# Patient Record
Sex: Female | Born: 1985 | Race: Black or African American | Hispanic: No
Health system: Southern US, Community
[De-identification: ages and names within clinical notes are randomized; demographics above are authoritative.]

## PROBLEM LIST (undated history)

## (undated) DIAGNOSIS — G43909 Migraine, unspecified, not intractable, without status migrainosus: Secondary | ICD-10-CM

## (undated) DIAGNOSIS — F988 Other specified behavioral and emotional disorders with onset usually occurring in childhood and adolescence: Secondary | ICD-10-CM

## (undated) DIAGNOSIS — G935 Compression of brain: Secondary | ICD-10-CM

## (undated) HISTORY — PX: KNEE SURGERY: SHX244

## (undated) HISTORY — DX: Migraine, unspecified, not intractable, without status migrainosus: G43.909

## (undated) HISTORY — DX: Other specified behavioral and emotional disorders with onset usually occurring in childhood and adolescence: F98.8

## (undated) HISTORY — PX: BREAST REDUCTION SURGERY: SHX8

---

## 2005-06-03 ENCOUNTER — Emergency Department (HOSPITAL_COMMUNITY): Admission: EM | Admit: 2005-06-03 | Discharge: 2005-06-03 | Payer: Self-pay | Admitting: Emergency Medicine

## 2005-10-05 ENCOUNTER — Emergency Department (HOSPITAL_COMMUNITY): Admission: EM | Admit: 2005-10-05 | Discharge: 2005-10-06 | Payer: Self-pay | Admitting: Emergency Medicine

## 2006-05-04 ENCOUNTER — Emergency Department (HOSPITAL_COMMUNITY): Admission: EM | Admit: 2006-05-04 | Discharge: 2006-05-04 | Payer: Self-pay | Admitting: Emergency Medicine

## 2006-12-30 ENCOUNTER — Emergency Department (HOSPITAL_COMMUNITY): Admission: EM | Admit: 2006-12-30 | Discharge: 2006-12-30 | Payer: Self-pay | Admitting: Emergency Medicine

## 2007-10-30 ENCOUNTER — Emergency Department (HOSPITAL_COMMUNITY): Admission: EM | Admit: 2007-10-30 | Discharge: 2007-10-30 | Payer: Self-pay | Admitting: Emergency Medicine

## 2008-02-03 ENCOUNTER — Emergency Department (HOSPITAL_COMMUNITY): Admission: EM | Admit: 2008-02-03 | Discharge: 2008-02-03 | Payer: Self-pay | Admitting: Emergency Medicine

## 2008-08-02 ENCOUNTER — Emergency Department (HOSPITAL_COMMUNITY): Admission: EM | Admit: 2008-08-02 | Discharge: 2008-08-02 | Payer: Self-pay | Admitting: Emergency Medicine

## 2008-08-17 ENCOUNTER — Emergency Department (HOSPITAL_COMMUNITY): Admission: EM | Admit: 2008-08-17 | Discharge: 2008-08-17 | Payer: Self-pay | Admitting: Emergency Medicine

## 2008-11-29 ENCOUNTER — Emergency Department (HOSPITAL_COMMUNITY): Admission: EM | Admit: 2008-11-29 | Discharge: 2008-11-29 | Payer: Self-pay | Admitting: Emergency Medicine

## 2009-03-13 ENCOUNTER — Emergency Department (HOSPITAL_COMMUNITY): Admission: EM | Admit: 2009-03-13 | Discharge: 2009-03-14 | Payer: Self-pay | Admitting: Emergency Medicine

## 2009-04-05 ENCOUNTER — Emergency Department (HOSPITAL_COMMUNITY): Admission: EM | Admit: 2009-04-05 | Discharge: 2009-04-05 | Payer: Self-pay | Admitting: Emergency Medicine

## 2010-12-20 ENCOUNTER — Emergency Department (HOSPITAL_COMMUNITY)
Admission: EM | Admit: 2010-12-20 | Discharge: 2010-12-20 | Disposition: A | Discharge status: Home / Self Care | Payer: BC Managed Care – PPO | Attending: Emergency Medicine | Admitting: Emergency Medicine

## 2010-12-20 ENCOUNTER — Emergency Department (HOSPITAL_COMMUNITY): Payer: BC Managed Care – PPO

## 2010-12-20 DIAGNOSIS — Y9343 Activity, gymnastics: Secondary | ICD-10-CM | POA: Insufficient documentation

## 2010-12-20 DIAGNOSIS — M25569 Pain in unspecified knee: Secondary | ICD-10-CM | POA: Insufficient documentation

## 2010-12-20 DIAGNOSIS — M2390 Unspecified internal derangement of unspecified knee: Secondary | ICD-10-CM | POA: Insufficient documentation

## 2010-12-20 DIAGNOSIS — Y92009 Unspecified place in unspecified non-institutional (private) residence as the place of occurrence of the external cause: Secondary | ICD-10-CM | POA: Insufficient documentation

## 2010-12-20 DIAGNOSIS — X58XXXA Exposure to other specified factors, initial encounter: Secondary | ICD-10-CM | POA: Insufficient documentation

## 2010-12-23 LAB — URINALYSIS, ROUTINE W REFLEX MICROSCOPIC
Bilirubin Urine: NEGATIVE
Glucose, UA: NEGATIVE mg/dL
Hgb urine dipstick: NEGATIVE
Ketones, ur: NEGATIVE mg/dL
Nitrite: NEGATIVE
Protein, ur: NEGATIVE mg/dL
Specific Gravity, Urine: 1.026 (ref 1.005–1.030)
Urobilinogen, UA: 0.2 mg/dL (ref 0.0–1.0)
pH: 6.5 (ref 5.0–8.0)

## 2010-12-23 LAB — POCT PREGNANCY, URINE: Preg Test, Ur: NEGATIVE

## 2010-12-23 LAB — URINE MICROSCOPIC-ADD ON

## 2010-12-24 LAB — URINALYSIS, ROUTINE W REFLEX MICROSCOPIC
Bilirubin Urine: NEGATIVE
Ketones, ur: NEGATIVE mg/dL
Nitrite: NEGATIVE
pH: 7.5 (ref 5.0–8.0)

## 2010-12-24 LAB — WET PREP, GENITAL
Clue Cells Wet Prep HPF POC: NONE SEEN
Trich, Wet Prep: NONE SEEN

## 2010-12-24 LAB — URINE MICROSCOPIC-ADD ON

## 2010-12-24 LAB — GC/CHLAMYDIA PROBE AMP, GENITAL: Chlamydia, DNA Probe: NEGATIVE

## 2010-12-24 LAB — POCT PREGNANCY, URINE: Preg Test, Ur: NEGATIVE

## 2010-12-27 LAB — URINALYSIS, ROUTINE W REFLEX MICROSCOPIC
Nitrite: NEGATIVE
Protein, ur: 100 mg/dL — AB
Specific Gravity, Urine: 1.043 — ABNORMAL HIGH (ref 1.005–1.030)
Urobilinogen, UA: 1 mg/dL (ref 0.0–1.0)

## 2010-12-27 LAB — GC/CHLAMYDIA PROBE AMP, GENITAL: Chlamydia, DNA Probe: NEGATIVE

## 2010-12-27 LAB — URINE MICROSCOPIC-ADD ON

## 2011-03-09 ENCOUNTER — Emergency Department (HOSPITAL_COMMUNITY): Payer: Self-pay

## 2011-03-09 ENCOUNTER — Emergency Department (HOSPITAL_COMMUNITY)
Admission: EM | Admit: 2011-03-09 | Discharge: 2011-03-09 | Disposition: A | Discharge status: Home / Self Care | Payer: Self-pay | Attending: Emergency Medicine | Admitting: Emergency Medicine

## 2011-03-09 DIAGNOSIS — Y92009 Unspecified place in unspecified non-institutional (private) residence as the place of occurrence of the external cause: Secondary | ICD-10-CM | POA: Insufficient documentation

## 2011-03-09 DIAGNOSIS — IMO0002 Reserved for concepts with insufficient information to code with codable children: Secondary | ICD-10-CM | POA: Insufficient documentation

## 2011-03-09 DIAGNOSIS — S4980XA Other specified injuries of shoulder and upper arm, unspecified arm, initial encounter: Secondary | ICD-10-CM | POA: Insufficient documentation

## 2011-03-09 DIAGNOSIS — M2390 Unspecified internal derangement of unspecified knee: Secondary | ICD-10-CM | POA: Insufficient documentation

## 2011-03-09 DIAGNOSIS — M25519 Pain in unspecified shoulder: Secondary | ICD-10-CM | POA: Insufficient documentation

## 2011-03-09 DIAGNOSIS — S8990XA Unspecified injury of unspecified lower leg, initial encounter: Secondary | ICD-10-CM | POA: Insufficient documentation

## 2011-03-09 DIAGNOSIS — M25469 Effusion, unspecified knee: Secondary | ICD-10-CM | POA: Insufficient documentation

## 2011-03-09 DIAGNOSIS — S46909A Unspecified injury of unspecified muscle, fascia and tendon at shoulder and upper arm level, unspecified arm, initial encounter: Secondary | ICD-10-CM | POA: Insufficient documentation

## 2011-03-09 DIAGNOSIS — W108XXA Fall (on) (from) other stairs and steps, initial encounter: Secondary | ICD-10-CM | POA: Insufficient documentation

## 2011-03-09 DIAGNOSIS — M25569 Pain in unspecified knee: Secondary | ICD-10-CM | POA: Insufficient documentation

## 2011-06-06 ENCOUNTER — Emergency Department (HOSPITAL_COMMUNITY)
Admission: EM | Admit: 2011-06-06 | Discharge: 2011-06-06 | Disposition: A | Discharge status: Home / Self Care | Payer: Self-pay | Attending: Emergency Medicine | Admitting: Emergency Medicine

## 2011-06-06 ENCOUNTER — Emergency Department (HOSPITAL_COMMUNITY): Payer: Self-pay

## 2011-06-06 DIAGNOSIS — M25469 Effusion, unspecified knee: Secondary | ICD-10-CM | POA: Insufficient documentation

## 2011-06-06 DIAGNOSIS — M25569 Pain in unspecified knee: Secondary | ICD-10-CM | POA: Insufficient documentation

## 2011-06-12 LAB — RAPID STREP SCREEN (MED CTR MEBANE ONLY): Streptococcus, Group A Screen (Direct): NEGATIVE

## 2011-06-21 LAB — WET PREP, GENITAL
Clue Cells Wet Prep HPF POC: NONE SEEN
Trich, Wet Prep: NONE SEEN
Yeast Wet Prep HPF POC: NONE SEEN

## 2011-06-21 LAB — URINALYSIS, ROUTINE W REFLEX MICROSCOPIC
Bilirubin Urine: NEGATIVE
Nitrite: NEGATIVE
Specific Gravity, Urine: 1.007 (ref 1.005–1.030)
Urobilinogen, UA: 0.2 mg/dL (ref 0.0–1.0)
pH: 7 (ref 5.0–8.0)

## 2011-06-21 LAB — GC/CHLAMYDIA PROBE AMP, GENITAL
Chlamydia, DNA Probe: NEGATIVE
GC Probe Amp, Genital: NEGATIVE

## 2011-07-08 ENCOUNTER — Emergency Department (HOSPITAL_COMMUNITY): Payer: Self-pay

## 2011-07-08 ENCOUNTER — Emergency Department (HOSPITAL_COMMUNITY)
Admission: EM | Admit: 2011-07-08 | Discharge: 2011-07-08 | Disposition: A | Discharge status: Home / Self Care | Payer: Self-pay | Attending: Emergency Medicine | Admitting: Emergency Medicine

## 2011-07-08 DIAGNOSIS — M25569 Pain in unspecified knee: Secondary | ICD-10-CM | POA: Insufficient documentation

## 2011-07-08 DIAGNOSIS — IMO0002 Reserved for concepts with insufficient information to code with codable children: Secondary | ICD-10-CM | POA: Insufficient documentation

## 2011-07-08 DIAGNOSIS — M25469 Effusion, unspecified knee: Secondary | ICD-10-CM | POA: Insufficient documentation

## 2011-07-08 DIAGNOSIS — W010XXA Fall on same level from slipping, tripping and stumbling without subsequent striking against object, initial encounter: Secondary | ICD-10-CM | POA: Insufficient documentation

## 2011-07-08 DIAGNOSIS — W540XXA Bitten by dog, initial encounter: Secondary | ICD-10-CM | POA: Insufficient documentation

## 2011-08-22 ENCOUNTER — Encounter: Payer: Self-pay | Admitting: Emergency Medicine

## 2011-08-22 ENCOUNTER — Emergency Department (HOSPITAL_COMMUNITY)
Admission: EM | Admit: 2011-08-22 | Discharge: 2011-08-23 | Disposition: A | Discharge status: Home / Self Care | Payer: Self-pay | Attending: Emergency Medicine | Admitting: Emergency Medicine

## 2011-08-22 DIAGNOSIS — N898 Other specified noninflammatory disorders of vagina: Secondary | ICD-10-CM | POA: Insufficient documentation

## 2011-08-22 DIAGNOSIS — N390 Urinary tract infection, site not specified: Secondary | ICD-10-CM | POA: Insufficient documentation

## 2011-08-22 NOTE — ED Notes (Signed)
Pt alert, nad, c/o vaginal "swelling", vagina "itch", onset two days ago, pt states "it may be chlamydia or a yeast infection", denies n/v or emesis

## 2011-08-23 LAB — URINE MICROSCOPIC-ADD ON

## 2011-08-23 LAB — URINALYSIS, ROUTINE W REFLEX MICROSCOPIC
Bilirubin Urine: NEGATIVE
Hgb urine dipstick: NEGATIVE
Nitrite: NEGATIVE
Specific Gravity, Urine: 1.028 (ref 1.005–1.030)
Urobilinogen, UA: 0.2 mg/dL (ref 0.0–1.0)
pH: 5.5 (ref 5.0–8.0)

## 2011-08-23 LAB — WET PREP, GENITAL
Clue Cells Wet Prep HPF POC: NONE SEEN
Trich, Wet Prep: NONE SEEN
Yeast Wet Prep HPF POC: NONE SEEN

## 2011-08-23 LAB — GC/CHLAMYDIA PROBE AMP, GENITAL: Chlamydia, DNA Probe: NEGATIVE

## 2011-08-23 MED ORDER — CIPROFLOXACIN HCL 500 MG PO TABS: 500.0000 mg | ORAL_TABLET | Freq: Two times a day (BID) | ORAL | Status: AC

## 2011-08-23 MED ORDER — CIPROFLOXACIN HCL 500 MG PO TABS
500.0000 mg | ORAL_TABLET | Freq: Once | ORAL | Status: AC
  Administered 2011-08-23: 500 mg via ORAL
  Filled 2011-08-23: qty 1

## 2011-08-23 NOTE — ED Notes (Signed)
Pelvic cart to bs

## 2011-08-23 NOTE — ED Notes (Signed)
Patient is resting comfortably. 

## 2011-08-23 NOTE — ED Provider Notes (Signed)
History     CSN: [id number] Arrival date & time: 08/22/2011 11:15 PM   First MD Initiated Contact with Patient 08/23/11 0107      Chief Complaint  Patient presents with  . Vaginal Discharge  . Vaginal Itching    (Consider location/radiation/quality/duration/timing/severity/associated sxs/prior treatment) Patient is a 25 y.o. female presenting with vaginal discharge and vaginal itching. The history is provided by the patient. No language interpreter was used.  Vaginal Discharge This is a new problem. The current episode started more than 2 days ago. The problem occurs constantly. The problem has not changed since onset.Pertinent negatives include no chest pain, no abdominal pain, no headaches and no shortness of breath. The symptoms are aggravated by nothing. The symptoms are relieved by nothing. She has tried nothing for the symptoms. The treatment provided no relief.  Vaginal Itching Pertinent negatives include no chest pain, no abdominal pain, no headaches and no shortness of breath.  States she feels inflamed in her pelvic area.    History reviewed. No pertinent past medical history.  History reviewed. No pertinent past surgical history.  No family history on file.  History  Substance Use Topics  . Smoking status: Never Smoker   . Smokeless tobacco: Not on file  . Alcohol Use: Yes    OB History    Grav Para Term Preterm Abortions TAB SAB Ect Mult Living                  Review of Systems  Constitutional: Negative for activity change.  HENT: Negative for facial swelling.   Eyes: Negative for discharge.  Respiratory: Negative for shortness of breath.   Cardiovascular: Negative for chest pain.  Gastrointestinal: Negative for abdominal pain and abdominal distention.  Genitourinary: Positive for vaginal discharge. Negative for flank pain, difficulty urinating and genital sores.  Musculoskeletal: Negative for arthralgias.  Skin: Negative.   Neurological: Negative for  headaches.  Hematological: Negative.   Psychiatric/Behavioral: Negative.     Allergies  Review of patient's allergies indicates no known allergies.  Home Medications   Current Outpatient Rx  Name Route Sig Dispense Refill  . IBUPROFEN 400 MG PO TABS Oral Take 800 mg by mouth every 6 (six) hours as needed. For pain.     Marland Kitchen CIPROFLOXACIN HCL 500 MG PO TABS Oral Take 1 tablet (500 mg total) by mouth 2 (two) times daily. 14 tablet 0    BP 123/74  Pulse 88  Temp(Src) 98 F (36.7 C) (Oral)  Resp 16  Wt 220 lb (99.791 kg)  SpO2 99%  LMP 07/01/2011  Physical Exam  Constitutional: She appears well-developed and well-nourished.  HENT:  Head: Normocephalic and atraumatic.  Eyes: EOM are normal. Pupils are equal, round, and reactive to light. Right eye exhibits no discharge. Left eye exhibits no discharge.  Neck: Normal range of motion. Neck supple.  Cardiovascular: Normal rate and regular rhythm.   Pulmonary/Chest: Effort normal and breath sounds normal. She has no wheezes.  Abdominal: Soft. Bowel sounds are normal. There is no tenderness. There is no rebound and no guarding.  Genitourinary: There is no rash, tenderness or lesion on the right labia. There is no rash, tenderness or lesion on the left labia. Cervix exhibits discharge. Cervix exhibits no motion tenderness and no friability. Right adnexum displays no mass and no tenderness. Left adnexum displays no mass and no tenderness.       Chaperone present  Musculoskeletal: Normal range of motion. She exhibits no edema.    ED  Course  Procedures (including critical care time)  Labs Reviewed  URINALYSIS, ROUTINE W REFLEX MICROSCOPIC - Abnormal; Notable for the following:    APPearance CLOUDY (*)    Leukocytes, UA MODERATE (*)    All other components within normal limits  URINE MICROSCOPIC-ADD ON - Abnormal; Notable for the following:    Squamous Epithelial / LPF MANY (*)    Bacteria, UA MANY (*)    All other components within  normal limits  WET PREP, GENITAL  POCT PREGNANCY, URINE  POCT PREGNANCY, URINE  GC/CHLAMYDIA PROBE AMP, GENITAL   No results found.   1. UTI (lower urinary tract infection)       MDM  Explained she will ne called with culture results and should follow up with GYN.  Patient verbalizes understanding and agrees to follow up        Rocket Gunderson Smitty Cords, MD 08/23/11 [phone]

## 2011-08-23 NOTE — ED Notes (Signed)
Called lab to check on ua reports that it is still processing

## 2012-03-07 ENCOUNTER — Emergency Department (HOSPITAL_COMMUNITY)
Admission: EM | Admit: 2012-03-07 | Discharge: 2012-03-07 | Disposition: A | Discharge status: Home / Self Care | Payer: Self-pay | Attending: Emergency Medicine | Admitting: Emergency Medicine

## 2012-03-07 ENCOUNTER — Encounter (HOSPITAL_COMMUNITY): Payer: Self-pay | Admitting: Emergency Medicine

## 2012-03-07 ENCOUNTER — Emergency Department (HOSPITAL_COMMUNITY): Payer: Self-pay

## 2012-03-07 DIAGNOSIS — W19XXXA Unspecified fall, initial encounter: Secondary | ICD-10-CM | POA: Insufficient documentation

## 2012-03-07 DIAGNOSIS — M25569 Pain in unspecified knee: Secondary | ICD-10-CM | POA: Insufficient documentation

## 2012-03-07 MED ORDER — OXYCODONE-ACETAMINOPHEN 5-325 MG PO TABS
1.0000 | ORAL_TABLET | Freq: Once | ORAL | Status: AC
  Administered 2012-03-07: 1 via ORAL
  Filled 2012-03-07: qty 1

## 2012-03-07 MED ORDER — OXYCODONE-ACETAMINOPHEN 5-325 MG PO TABS: 2.0000 | ORAL_TABLET | ORAL | Status: AC | PRN

## 2012-03-07 MED ORDER — IBUPROFEN 800 MG PO TABS: 800.0000 mg | ORAL_TABLET | Freq: Three times a day (TID) | ORAL | Status: AC

## 2012-03-07 MED ORDER — IBUPROFEN 800 MG PO TABS
800.0000 mg | ORAL_TABLET | Freq: Once | ORAL | Status: AC
  Administered 2012-03-07: 800 mg via ORAL
  Filled 2012-03-07: qty 1

## 2012-03-07 NOTE — ED Provider Notes (Signed)
History     CSN: [id number]  Arrival date & time 03/07/12  1024   First MD Initiated Contact with Patient 03/07/12 1120      Chief Complaint  Patient presents with  . Knee Pain    (Consider location/radiation/quality/duration/timing/severity/associated sxs/prior treatment) Patient is a 26 y.o. female presenting with knee pain. The history is provided by the patient. No language interpreter was used.  Knee Pain This is a new problem. The current episode started today. The problem occurs constantly. The problem has been unchanged. Pertinent negatives include no joint swelling, nausea, vomiting or weakness. The symptoms are aggravated by walking and twisting. She has tried nothing for the symptoms.   26 year old female reports that she fell while intoxicated at 3 AM and injured her right knee. States after she injured her knee she went to sleep and when she woke up she was in pain. States she cannot put pressure on that leg and she has taken nothing for pain that is a 10 out of 10. No swelling noted 2+ right pedal pulse and good sensation to the right foot.  History reviewed. No pertinent past medical history.  Past Surgical History  Procedure Date  . Knee surgery   . Breast reduction surgery     History reviewed. No pertinent family history.  History  Substance Use Topics  . Smoking status: Never Smoker   . Smokeless tobacco: Not on file  . Alcohol Use: Yes     occasionally    OB History    Grav Para Term Preterm Abortions TAB SAB Ect Mult Living                  Review of Systems  Constitutional: Negative.   HENT: Negative.   Eyes: Negative.   Respiratory: Negative.   Cardiovascular: Negative.   Gastrointestinal: Negative.  Negative for nausea and vomiting.  Musculoskeletal: Negative for joint swelling.  Skin: Negative.   Neurological: Negative.  Negative for weakness.  Psychiatric/Behavioral: Negative.   All other systems reviewed and are  negative.    Allergies  Review of patient's allergies indicates no known allergies.  Home Medications   Current Outpatient Rx  Name Route Sig Dispense Refill  . IBUPROFEN 800 MG PO TABS Oral Take 1 tablet (800 mg total) by mouth 3 (three) times daily. 21 tablet 0  . OXYCODONE-ACETAMINOPHEN 5-325 MG PO TABS Oral Take 2 tablets by mouth every 4 (four) hours as needed for pain. 12 tablet 0    BP 113/62  Pulse 74  Temp 98.5 F (36.9 C) (Oral)  Resp 16  Ht 5\' 1"  (1.549 m)  Wt 223 lb (101.152 kg)  BMI 42.14 kg/m2  SpO2 97%  LMP 02/16/2012  Physical Exam  Nursing note and vitals reviewed. Constitutional: She is oriented to person, place, and time. She appears well-developed and well-nourished.  HENT:  Head: Normocephalic and atraumatic.  Eyes: Conjunctivae and EOM are normal. Pupils are equal, round, and reactive to light.  Neck: Normal range of motion. Neck supple.  Cardiovascular: Normal rate, regular rhythm and intact distal pulses.   Pulmonary/Chest: Effort normal and breath sounds normal.  Abdominal: Soft. Bowel sounds are normal.  Musculoskeletal: Normal range of motion. She exhibits tenderness. She exhibits no edema.       Right knee tenderness no swelling noted.  Neurological: She is alert and oriented to person, place, and time. She has normal reflexes.  Skin: Skin is warm and dry.  Psychiatric: She has a normal mood and  affect.    ED Course  Procedures (including critical care time)  Labs Reviewed - No data to display Dg Knee Complete 4 Views Right  03/07/2012  *RADIOLOGY REPORT*  Clinical Data: Right knee pain.  RIGHT KNEE - COMPLETE 4+ VIEW  Comparison: None  Findings: No evidence of acute fracture, subluxation or dislocation identified.  No joint effusion noted.  No radio-opaque foreign bodies are present.  No focal bony lesions are noted.  The joint spaces are unremarkable.  IMPRESSION: No evidence of acute abnormality.  Original Report Authenticated By: Rosendo Gros, M.D.     1. Knee pain       MDM  Followup follow intoxicated his right knee pain. X-ray negative for fracture. Knee immobilizer crutches provided. Instructed to use ice and elevate. Followup with ortho if not better in several days.        Remi Haggard, NP 03/07/12 1201

## 2012-03-07 NOTE — ED Provider Notes (Signed)
Medical screening examination/treatment/procedure(s) were performed by non-physician practitioner and as supervising physician I was immediately available for consultation/collaboration.  Deniyah Dillavou R. Draylen Lobue, MD 03/07/12 1623 

## 2012-03-07 NOTE — ED Notes (Signed)
Patient reports that she fell this am at 0400 - the patient reports that she is unable to walk and pain radiates into the toes

## 2012-03-07 NOTE — Discharge Instructions (Signed)
Ms Weill we gave you Percocet and ibuprofen in the ER for your pain. Keep the knee elevated above your heart with ice on it intermittently for the next 24 hours. Take ibuprofen 800 mg every 6 hours with food x24 hours. Take the Percocet as needed for extreme pain but do not drive with this medication. Use the knee immobilizer and crutches until followup with orthopedic physician as needed.  Return to the ER for severe pain or weakness to the right lower extremity. Knee Pain The knee is the complex joint between your thigh and your lower leg. It is made up of bones, tendons, ligaments, and cartilage. The bones that make up the knee are:  The femur in the thigh.   The tibia and fibula in the lower leg.   The patella or kneecap riding in the groove on the lower femur.  CAUSES  Knee pain is a common complaint with many causes. A few of these causes are:  Injury, such as:   A ruptured ligament or tendon injury.   Torn cartilage.   Medical conditions, such as:   Gout   Arthritis   Infections   Overuse, over training or overdoing a physical activity.  Knee pain can be minor or severe. Knee pain can accompany debilitating injury. Minor knee problems often respond well to self-care measures or get well on their own. More serious injuries may need medical intervention or even surgery. SYMPTOMS The knee is complex. Symptoms of knee problems can vary widely. Some of the problems are:  Pain with movement and weight bearing.   Swelling and tenderness.   Buckling of the knee.   Inability to straighten or extend your knee.   Your knee locks and you cannot straighten it.   Warmth and redness with pain and fever.   Deformity or dislocation of the kneecap.  DIAGNOSIS  Determining what is wrong may be very straight forward such as when there is an injury. It can also be challenging because of the complexity of the knee. Tests to make a diagnosis may include:  Your caregiver taking a  history and doing a physical exam.   Routine X-rays can be used to rule out other problems. X-rays will not reveal a cartilage tear. Some injuries of the knee can be diagnosed by:   Arthroscopy a surgical technique by which a small video camera is inserted through tiny incisions on the sides of the knee. This procedure is used to examine and repair internal knee joint problems. Tiny instruments can be used during arthroscopy to repair the torn knee cartilage (meniscus).   Arthrography is a radiology technique. A contrast liquid is directly injected into the knee joint. Internal structures of the knee joint then become visible on X-ray film.   An MRI scan is a non x-ray radiology procedure in which magnetic fields and a computer produce two- or three-dimensional images of the inside of the knee. Cartilage tears are often visible using an MRI scanner. MRI scans have largely replaced arthrography in diagnosing cartilage tears of the knee.   Blood work.   Examination of the fluid that helps to lubricate the knee joint (synovial fluid). This is done by taking a sample out using a needle and a syringe.  TREATMENT The treatment of knee problems depends on the cause. Some of these treatments are:  Depending on the injury, proper casting, splinting, surgery or physical therapy care will be needed.   Give yourself adequate recovery time. Do not overuse your  joints. If you begin to get sore during workout routines, back off. Slow down or do fewer repetitions.   For repetitive activities such as cycling or running, maintain your strength and nutrition.   Alternate muscle groups. For example if you are a weight lifter, work the upper body on one day and the lower body the next.   Either tight or weak muscles do not give the proper support for your knee. Tight or weak muscles do not absorb the stress placed on the knee joint. Keep the muscles surrounding the knee strong.   Take care of mechanical  problems.   If you have flat feet, orthotics or special shoes may help. See your caregiver if you need help.   Arch supports, sometimes with wedges on the inner or outer aspect of the heel, can help. These can shift pressure away from the side of the knee most bothered by osteoarthritis.   A brace called an "unloader" brace also may be used to help ease the pressure on the most arthritic side of the knee.   If your caregiver has prescribed crutches, braces, wraps or ice, use as directed. The acronym for this is PRICE. This means protection, rest, ice, compression and elevation.   Nonsteroidal anti-inflammatory drugs (NSAID's), can help relieve pain. But if taken immediately after an injury, they may actually increase swelling. Take NSAID's with food in your stomach. Stop them if you develop stomach problems. Do not take these if you have a history of ulcers, stomach pain or bleeding from the bowel. Do not take without your caregiver's approval if you have problems with fluid retention, heart failure, or kidney problems.   For ongoing knee problems, physical therapy may be helpful.   Glucosamine and chondroitin are over-the-counter dietary supplements. Both may help relieve the pain of osteoarthritis in the knee. These medicines are different from the usual anti-inflammatory drugs. Glucosamine may decrease the rate of cartilage destruction.   Injections of a corticosteroid drug into your knee joint may help reduce the symptoms of an arthritis flare-up. They may provide pain relief that lasts a few months. You may have to wait a few months between injections. The injections do have a small increased risk of infection, water retention and elevated blood sugar levels.   Hyaluronic acid injected into damaged joints may ease pain and provide lubrication. These injections may work by reducing inflammation. A series of shots may give relief for as long as 6 months.   Topical painkillers. Applying certain  ointments to your skin may help relieve the pain and stiffness of osteoarthritis. Ask your pharmacist for suggestions. Many over the-counter products are approved for temporary relief of arthritis pain.   In some countries, doctors often prescribe topical NSAID's for relief of chronic conditions such as arthritis and tendinitis. A review of treatment with NSAID creams found that they worked as well as oral medications but without the serious side effects.  PREVENTION  Maintain a healthy weight. Extra pounds put more strain on your joints.   Get strong, stay limber. Weak muscles are a common cause of knee injuries. Stretching is important. Include flexibility exercises in your workouts.   Be smart about exercise. If you have osteoarthritis, chronic knee pain or recurring injuries, you may need to change the way you exercise. This does not mean you have to stop being active. If your knees ache after jogging or playing basketball, consider switching to swimming, water aerobics or other low-impact activities, at least for a few  days a week. Sometimes limiting high-impact activities will provide relief.   Make sure your shoes fit well. Choose footwear that is right for your sport.   Protect your knees. Use the proper gear for knee-sensitive activities. Use kneepads when playing volleyball or laying carpet. Buckle your seat belt every time you drive. Most shattered kneecaps occur in car accidents.   Rest when you are tired.  SEEK MEDICAL CARE IF:  You have knee pain that is continual and does not seem to be getting better.  SEEK IMMEDIATE MEDICAL CARE IF:  Your knee joint feels hot to the touch and you have a high fever. MAKE SURE YOU:   Understand these instructions.   Will watch your condition.   Will get help right away if you are not doing well or get worse.  Document Released: 06/30/2007 Document Revised: 08/22/2011 Document Reviewed: 06/30/2007 [facility] Patient Information 2012  [place], [place].

## 2012-03-14 ENCOUNTER — Emergency Department (HOSPITAL_COMMUNITY)
Admission: EM | Admit: 2012-03-14 | Discharge: 2012-03-14 | Disposition: A | Discharge status: Home / Self Care | Payer: Self-pay | Attending: Emergency Medicine | Admitting: Emergency Medicine

## 2012-03-14 ENCOUNTER — Encounter (HOSPITAL_COMMUNITY): Payer: Self-pay | Admitting: *Deleted

## 2012-03-14 DIAGNOSIS — M25561 Pain in right knee: Secondary | ICD-10-CM

## 2012-03-14 DIAGNOSIS — Z9181 History of falling: Secondary | ICD-10-CM | POA: Insufficient documentation

## 2012-03-14 DIAGNOSIS — M25569 Pain in unspecified knee: Secondary | ICD-10-CM | POA: Insufficient documentation

## 2012-03-14 MED ORDER — HYDROCODONE-ACETAMINOPHEN 5-325 MG PO TABS: 1.0000 | ORAL_TABLET | Freq: Four times a day (QID) | ORAL | Status: AC | PRN

## 2012-03-14 NOTE — Discharge Instructions (Signed)
Read the information below.  Please use the knee immobilizer and crutches given to you on your previous visit.  Please call the orthopedist above for a close follow up appointment.  Continue using the RICE treatment listed above.  If you develop fever, increased swelling, uncontrolled pain, or inability to walk, return to the ER for a recheck.  You may return to the ER at any time for worsening condition or any new symptoms that concern you.   Knee Pain The knee is the complex joint between your thigh and your lower leg. It is made up of bones, tendons, ligaments, and cartilage. The bones that make up the knee are:  The femur in the thigh.   The tibia and fibula in the lower leg.   The patella or kneecap riding in the groove on the lower femur.  CAUSES  Knee pain is a common complaint with many causes. A few of these causes are:  Injury, such as:   A ruptured ligament or tendon injury.   Torn cartilage.   Medical conditions, such as:   Gout   Arthritis   Infections   Overuse, over training or overdoing a physical activity.  Knee pain can be minor or severe. Knee pain can accompany debilitating injury. Minor knee problems often respond well to self-care measures or get well on their own. More serious injuries may need medical intervention or even surgery. SYMPTOMS The knee is complex. Symptoms of knee problems can vary widely. Some of the problems are:  Pain with movement and weight bearing.   Swelling and tenderness.   Buckling of the knee.   Inability to straighten or extend your knee.   Your knee locks and you cannot straighten it.   Warmth and redness with pain and fever.   Deformity or dislocation of the kneecap.  DIAGNOSIS  Determining what is wrong may be very straight forward such as when there is an injury. It can also be challenging because of the complexity of the knee. Tests to make a diagnosis may include:  Your caregiver taking a history and doing a  physical exam.   Routine X-rays can be used to rule out other problems. X-rays will not reveal a cartilage tear. Some injuries of the knee can be diagnosed by:   Arthroscopy a surgical technique by which a small video camera is inserted through tiny incisions on the sides of the knee. This procedure is used to examine and repair internal knee joint problems. Tiny instruments can be used during arthroscopy to repair the torn knee cartilage (meniscus).   Arthrography is a radiology technique. A contrast liquid is directly injected into the knee joint. Internal structures of the knee joint then become visible on X-ray film.   An MRI scan is a non x-ray radiology procedure in which magnetic fields and a computer produce two- or three-dimensional images of the inside of the knee. Cartilage tears are often visible using an MRI scanner. MRI scans have largely replaced arthrography in diagnosing cartilage tears of the knee.   Blood work.   Examination of the fluid that helps to lubricate the knee joint (synovial fluid). This is done by taking a sample out using a needle and a syringe.  TREATMENT The treatment of knee problems depends on the cause. Some of these treatments are:  Depending on the injury, proper casting, splinting, surgery or physical therapy care will be needed.   Give yourself adequate recovery time. Do not overuse your joints. If you begin  to get sore during workout routines, back off. Slow down or do fewer repetitions.   For repetitive activities such as cycling or running, maintain your strength and nutrition.   Alternate muscle groups. For example if you are a weight lifter, work the upper body on one day and the lower body the next.   Either tight or weak muscles do not give the proper support for your knee. Tight or weak muscles do not absorb the stress placed on the knee joint. Keep the muscles surrounding the knee strong.   Take care of mechanical problems.   If you have  flat feet, orthotics or special shoes may help. See your caregiver if you need help.   Arch supports, sometimes with wedges on the inner or outer aspect of the heel, can help. These can shift pressure away from the side of the knee most bothered by osteoarthritis.   A brace called an "unloader" brace also may be used to help ease the pressure on the most arthritic side of the knee.   If your caregiver has prescribed crutches, braces, wraps or ice, use as directed. The acronym for this is PRICE. This means protection, rest, ice, compression and elevation.   Nonsteroidal anti-inflammatory drugs (NSAID's), can help relieve pain. But if taken immediately after an injury, they may actually increase swelling. Take NSAID's with food in your stomach. Stop them if you develop stomach problems. Do not take these if you have a history of ulcers, stomach pain or bleeding from the bowel. Do not take without your caregiver's approval if you have problems with fluid retention, heart failure, or kidney problems.   For ongoing knee problems, physical therapy may be helpful.   Glucosamine and chondroitin are over-the-counter dietary supplements. Both may help relieve the pain of osteoarthritis in the knee. These medicines are different from the usual anti-inflammatory drugs. Glucosamine may decrease the rate of cartilage destruction.   Injections of a corticosteroid drug into your knee joint may help reduce the symptoms of an arthritis flare-up. They may provide pain relief that lasts a few months. You may have to wait a few months between injections. The injections do have a small increased risk of infection, water retention and elevated blood sugar levels.   Hyaluronic acid injected into damaged joints may ease pain and provide lubrication. These injections may work by reducing inflammation. A series of shots may give relief for as long as 6 months.   Topical painkillers. Applying certain ointments to your skin  may help relieve the pain and stiffness of osteoarthritis. Ask your pharmacist for suggestions. Many over the-counter products are approved for temporary relief of arthritis pain.   In some countries, doctors often prescribe topical NSAID's for relief of chronic conditions such as arthritis and tendinitis. A review of treatment with NSAID creams found that they worked as well as oral medications but without the serious side effects.  PREVENTION  Maintain a healthy weight. Extra pounds put more strain on your joints.   Get strong, stay limber. Weak muscles are a common cause of knee injuries. Stretching is important. Include flexibility exercises in your workouts.   Be smart about exercise. If you have osteoarthritis, chronic knee pain or recurring injuries, you may need to change the way you exercise. This does not mean you have to stop being active. If your knees ache after jogging or playing basketball, consider switching to swimming, water aerobics or other low-impact activities, at least for a few days a week. Sometimes  limiting high-impact activities will provide relief.   Make sure your shoes fit well. Choose footwear that is right for your sport.   Protect your knees. Use the proper gear for knee-sensitive activities. Use kneepads when playing volleyball or laying carpet. Buckle your seat belt every time you drive. Most shattered kneecaps occur in car accidents.   Rest when you are tired.  SEEK MEDICAL CARE IF:  You have knee pain that is continual and does not seem to be getting better.  SEEK IMMEDIATE MEDICAL CARE IF:  Your knee joint feels hot to the touch and you have a high fever. MAKE SURE YOU:   Understand these instructions.   Will watch your condition.   Will get help right away if you are not doing well or get worse.  Document Released: 06/30/2007 Document Revised: 08/22/2011 Document Reviewed: 06/30/2007 [facility] Patient Information 2012 [place], [place].  RESOURCE  GUIDE  Chronic Pain Problems: Contact [facility]  [phone] Patients need to be referred by their primary care doctor.  Insufficient Money for Medicine: Contact United Way:  call "211" or [facility] 215-400-1962.  No Primary Care Doctor: - Call [facility]  [phone] - can help you locate a primary care doctor that  accepts your insurance, provides certain services, etc. - Physician Referral Service- [phone]  Agencies that provide inexpensive medical care: - [facility]  [phone] - [facility]  [phone] - [facility]  [phone] - Women's Clinic  [phone] - Planned Parenthood  [phone] [facility]  [phone]  Medicaid-accepting [facility] Providers: - [facility]- [address]  [phone], Mon-Fri 9am-7pm, Sat 9am-1pm - [facility]- [address] [place], [address] [place]  [phone] - [facility]- [address] [place]  [phone] [facility]- [address]  [phone] - Renaye Rakers- [address] [place], [address], [phone]  Only accepts [place] patients after they have their name  applied to their card  Self Pay (no insurance) in [place]: - Sickle Cell Patients: Dr Willey Blade, [facility]  [address] [place], [phone] - [facility]- [address] [place]  [phone]       [facility] [place]- [address]       -     [facility]- see information above (Speak to [employer] if you do not have insurance)       -  [facility]- [address] [place], [phone]       -  [facility]- [address],  [phone]       -  [facility]- [address] [phone]       -  Dr Julio Sicks-  [address], [address], [place], [phone]       -  [facility]- [address]  [phone]       -  [facility]- [address], [phone], also [address] [phone]       -    [facility]- [address] [place], [phone], 1st & 3rd Saturday   every month, 10am-1pm  1) Find a Doctor and Pay Out of Pocket Although you won't have to find out who is covered by  your insurance plan, it is a good idea to ask around and get recommendations. You will then need to call the office and see if the doctor you have chosen will accept you as a new patient and what types of options they offer for patients who are self-pay. Some doctors offer discounts or will set up payment plans for their patients who do not have insurance, but you will need to ask so you aren't surprised when you get to your appointment.  2) Contact Your Local Health Department Not all health departments have doctors that can see patients for sick visits, but many do, so it is worth a call to see if yours does. If you don't know where your local health department is, you can check in your phone book. The CDC also has a tool to help you locate your state's health department, and many state websites also have listings of all of their local health departments.  3) Find a Walk-in Clinic If your illness is not likely to be very severe or complicated, you may want to try a walk in clinic. These are popping up all over the country in pharmacies, drugstores, and shopping centers. They're usually staffed by nurse practitioners or physician assistants that have been trained to treat common illnesses and complaints. They're usually fairly quick and inexpensive. However, if you have serious medical issues or chronic medical problems, these are probably not your best option  STD Testing - [facility] Department of [facility], STD Clinic, [address], [place], phone [phone] or [phone].  Monday - Friday, call for an appointment. [facility] Department of [employer], STD Clinic, [address], [place], phone [phone] or [phone].  Monday - Friday, call for an appointment.  Abuse/Neglect: [facility] Child Abuse Hotline 315-182-6647 [facility] Child Abuse Hotline (715)224-2245 (After Hours)  Emergency Shelter:  [facility] (510) 099-8446  Maternity Homes: - [facility] (763) 134-8591 - [facility] [phone]  MRSA Hotline #:   9898430904  [facility] Resources  [facility] [place]  United Way [facility] Dept. [address]                 [address]         [address] [place] [address]  Blondell Reveal Phone:  [phone]                                  Phone:  [phone]                   Phone:  [phone]  [facility] Mental [facility], [phone] - [phone]       -     [facility] in [place], [address],  [phone], [facility] Child Abuse Hotline 416-671-0746 or 706-371-2253 (After Hours)   Behavioral Health Services  Substance Abuse Resources: - [facility] Drug Services  9283823002 - [facility] 581-829-8595 - [facility] [phone] Floydene Flock (367)[phone] - Residential & Outpatient Substance Abuse Program  (928)251-0421  Psychological Services: [facility]  [phone] [facility] Services  [phone] - [facility], [phone] [place]. [address], [place], ACCESS LINE: (418)561-5936 or 508-036-8313, [employer]  Dental Assistance  If unable to pay or uninsured, contact:  [facility] or [facility]. to become qualified for the adult dental clinic.  Patients with Medicaid:  [facility] [phone] [address], [phone] [address] [phone]  If unable to pay, or uninsured, contact [facility] 843-667-7225) or [facility] Department [phone] in [place], [phone] in [facility]) to become qualified for the adult dental clinic  Other Low-Cost Community Dental Services: - [facility]- [address] [place], [place], [place], [address], [phone], [phone], 2nd and 4th Thursday of the month at 6:30am.  10 clients each day by appointment, can sometimes see walk-in patients if someone does not show for an appointment. [facility]- [address] [place], [place], [address], [phone] - [facility]- [address] [place], [place], [address], [phone] - [place] [facility]- [phone] [facility] Department- [phone] [facility] Department- [phone]

## 2012-03-14 NOTE — ED Provider Notes (Signed)
Medical screening examination/treatment/procedure(s) were performed by non-physician practitioner and as supervising physician I was immediately available for consultation/collaboration.  Keoni Havey T Shalom Mcguiness, MD 03/14/12 1544 

## 2012-03-14 NOTE — ED Notes (Signed)
Pt from home with reports of losing balance and injuring right knee last Saturday. Pt reports being treated here last Saturday for same but endorses that different type of pain started 2 days ago "like the bones are rubbing together". Brace noted to right knee.

## 2012-03-14 NOTE — ED Provider Notes (Signed)
History     CSN: [id number]  Arrival date & time 03/14/12  1023   First MD Initiated Contact with Patient 03/14/12 1052      Chief Complaint  Patient presents with  . Knee Injury    right  . Knee Pain    right    (Consider location/radiation/quality/duration/timing/severity/associated sxs/prior treatment) HPI Comments: Patient seen 03/07/12 for right knee pain following fall while walking.  Pt was intoxicated at the time.  Seen in ED with negative xray.  Pt was placed in knee immobilizer and given crutches.  Pt has since stopped wearing the knee immobilizer and has been wearing her own home brace, has stopped using the crutches because they hurt her underarms.  Pt returns to the ED because of continued pain in her right knee - states the swelling has decreased, but she feels that the bones in her knee are rubbing together.  No new injury, no weakness or numbness of the leg.    Patient is a 26 y.o. female presenting with knee pain. The history is provided by the patient.  Knee Pain Pertinent negatives include no fever, joint swelling, numbness or weakness.    History reviewed. No pertinent past medical history.  Past Surgical History  Procedure Date  . Knee surgery   . Breast reduction surgery     History reviewed. No pertinent family history.  History  Substance Use Topics  . Smoking status: Never Smoker   . Smokeless tobacco: Never Used  . Alcohol Use: Yes     occasionally    OB History    Grav Para Term Preterm Abortions TAB SAB Ect Mult Living                  Review of Systems  Constitutional: Negative for fever.  Musculoskeletal: Negative for joint swelling.  Neurological: Negative for weakness and numbness.    Allergies  Review of patient's allergies indicates no known allergies.  Home Medications   Current Outpatient Rx  Name Route Sig Dispense Refill  . IBUPROFEN 800 MG PO TABS Oral Take 1 tablet (800 mg total) by mouth 3 (three) times daily. 21  tablet 0  . OXYCODONE-ACETAMINOPHEN 5-325 MG PO TABS Oral Take 2 tablets by mouth every 4 (four) hours as needed for pain. 12 tablet 0    BP 118/60  Pulse 63  Temp 97.5 F (36.4 C) (Oral)  Resp 18  Ht 5' 1.5" (1.562 m)  Wt 223 lb (101.152 kg)  BMI 41.45 kg/m2  SpO2 100%  LMP 02/16/2012  Physical Exam  Nursing note and vitals reviewed. Constitutional: She is oriented to person, place, and time. She appears well-developed and well-nourished. No distress.  HENT:  Head: Normocephalic and atraumatic.  Neck: Neck supple.  Pulmonary/Chest: Effort normal.  Musculoskeletal:       Right knee: She exhibits normal range of motion, no swelling, no effusion, no ecchymosis, no deformity, no laceration, no erythema, normal alignment, no LCL laxity and no MCL laxity.       Tenderness around joint line.  No bony tenderness.  Anterior and posterior stress negative.  Pt unable to stand on right leg without pain.  Lower extremities: strength 5/5, sensation intact, distal pulses intact.   Neurological: She is alert and oriented to person, place, and time.  Skin: She is not diaphoretic.    ED Course  Procedures (including critical care time)  Labs Reviewed - No data to display No results found.   1. Right knee pain  MDM  Patient with right knee pain since fall while walking on 6/22, seen in ED with negative xray.  Pt reports knee swelling is improving and she has improved overall but continues to have pain in her knee.  She has not followed the instructions given to her, not using crutches or knee immobilizer.  Her leg is neurovascularly intact.  No bony tenderness or need for reimaging at this time.  Pt d/c home with norco for pain, advised to continue treatment as recommended previously, orthopedic follow up.  Return precautions given.  Patient verbalizes understanding and agrees with plan.          Rise Patience, [place] 03/14/12 1226

## 2019-07-07 ENCOUNTER — Other Ambulatory Visit: Payer: Self-pay

## 2019-07-07 DIAGNOSIS — Z20822 Contact with and (suspected) exposure to covid-19: Secondary | ICD-10-CM

## 2019-07-09 LAB — NOVEL CORONAVIRUS, NAA: SARS-CoV-2, NAA: NOT DETECTED

## 2019-08-07 ENCOUNTER — Other Ambulatory Visit: Payer: Self-pay

## 2019-08-07 DIAGNOSIS — Z20822 Contact with and (suspected) exposure to covid-19: Secondary | ICD-10-CM

## 2019-08-09 LAB — NOVEL CORONAVIRUS, NAA: SARS-CoV-2, NAA: NOT DETECTED

## 2019-08-30 ENCOUNTER — Other Ambulatory Visit: Payer: Self-pay

## 2019-08-30 DIAGNOSIS — Z20822 Contact with and (suspected) exposure to covid-19: Secondary | ICD-10-CM

## 2019-08-31 LAB — NOVEL CORONAVIRUS, NAA: SARS-CoV-2, NAA: DETECTED — AB

## 2020-02-15 ENCOUNTER — Other Ambulatory Visit: Payer: Self-pay

## 2020-03-17 ENCOUNTER — Ambulatory Visit: Payer: HRSA Program | Attending: Internal Medicine

## 2020-03-17 DIAGNOSIS — Z20822 Contact with and (suspected) exposure to covid-19: Secondary | ICD-10-CM | POA: Insufficient documentation

## 2020-03-18 LAB — NOVEL CORONAVIRUS, NAA: SARS-CoV-2, NAA: NOT DETECTED

## 2020-03-18 LAB — SARS-COV-2, NAA 2 DAY TAT

## 2020-03-26 ENCOUNTER — Other Ambulatory Visit: Payer: Self-pay

## 2020-03-26 ENCOUNTER — Emergency Department: Payer: Self-pay

## 2020-03-26 ENCOUNTER — Emergency Department
Admission: EM | Admit: 2020-03-26 | Discharge: 2020-03-26 | Disposition: A | Discharge status: Home / Self Care | Payer: Self-pay | Attending: Emergency Medicine | Admitting: Emergency Medicine

## 2020-03-26 DIAGNOSIS — R079 Chest pain, unspecified: Secondary | ICD-10-CM | POA: Insufficient documentation

## 2020-03-26 LAB — CBC
HCT: 40.5 % (ref 36.0–46.0)
Hemoglobin: 13.4 g/dL (ref 12.0–15.0)
MCH: 30 pg (ref 26.0–34.0)
MCHC: 33.1 g/dL (ref 30.0–36.0)
MCV: 90.6 fL (ref 80.0–100.0)
Platelets: 276 10*3/uL (ref 150–400)
RBC: 4.47 MIL/uL (ref 3.87–5.11)
RDW: 12.4 % (ref 11.5–15.5)
WBC: 5.8 10*3/uL (ref 4.0–10.5)
nRBC: 0 % (ref 0.0–0.2)

## 2020-03-26 LAB — POC URINE PREG, ED: Preg Test, Ur: NEGATIVE

## 2020-03-26 LAB — BASIC METABOLIC PANEL
Anion gap: 7 (ref 5–15)
BUN: 20 mg/dL (ref 6–20)
CO2: 25 mmol/L (ref 22–32)
Calcium: 9 mg/dL (ref 8.9–10.3)
Chloride: 106 mmol/L (ref 98–111)
Creatinine, Ser: 0.68 mg/dL (ref 0.44–1.00)
GFR calc Af Amer: >60 mL/min (ref >60)
GFR calc non Af Amer: >60 mL/min (ref >60)
Glucose, Bld: 71 mg/dL (ref 70–99)
Potassium: 4 mmol/L (ref 3.5–5.1)
Sodium: 138 mmol/L (ref 135–145)

## 2020-03-26 LAB — URINALYSIS, COMPLETE (UACMP) WITH MICROSCOPIC
Bacteria, UA: NONE SEEN
Bilirubin Urine: NEGATIVE
Glucose, UA: NEGATIVE mg/dL
Hgb urine dipstick: NEGATIVE
Ketones, ur: NEGATIVE mg/dL
Leukocytes,Ua: NEGATIVE
Nitrite: NEGATIVE
Protein, ur: NEGATIVE mg/dL
Specific Gravity, Urine: 1.025 (ref 1.005–1.030)
pH: 7 (ref 5.0–8.0)

## 2020-03-26 LAB — WET PREP, GENITAL
Clue Cells Wet Prep HPF POC: NONE SEEN
Sperm: NONE SEEN
Trich, Wet Prep: NONE SEEN
Yeast Wet Prep HPF POC: NONE SEEN

## 2020-03-26 LAB — CHLAMYDIA/NGC RT PCR (ARMC ONLY)
Chlamydia Tr: NOT DETECTED
N gonorrhoeae: NOT DETECTED

## 2020-03-26 LAB — TROPONIN I (HIGH SENSITIVITY): Troponin I (High Sensitivity): 3 ng/L (ref <18)

## 2020-03-26 MED ORDER — PANTOPRAZOLE SODIUM 20 MG PO TBEC
20.0000 mg | DELAYED_RELEASE_TABLET | Freq: Every day | ORAL | 0 refills | Status: DC
Start: 2020-03-26 — End: 2021-01-30

## 2020-03-26 NOTE — ED Triage Notes (Signed)
Pt arrives to ER c/o of central CP with "lethargy" since she woke up today at 9am. Ate a burger before coming to ER. A&O, ambulatory. No lethargy noted at this time.

## 2020-03-26 NOTE — ED Notes (Signed)
See triage note, pt reports left sided chest pressure since 9 am when she woke up. Reports also feeling pain in left side of body for the past few weeks intermittently. Denies SHOB, N/V at this time, but report some nausea in the past few weeks.  Pt in NAD at this time. RR even and unlabored.

## 2020-03-26 NOTE — ED Provider Notes (Signed)
[facility] Emergency Department Provider Note  ____________________________________________   First MD Initiated Contact with Patient 03/26/20 1347     (approximate)  I have reviewed the triage vital signs and the nursing notes.   HISTORY  Chief Complaint Chest Pain    HPI Judy Park is a 34 y.o. female who comes in with central chest pain and lethargy since she woke up today at 9 AM.  Patient reports that around 9 AM she started having a little bit of chest pain.  It was a dull pain in the middle of her chest.  The pain lasted a few hours and then gradually went away.  Denies having any pain currently.  Was able to eat a burger.  She also reports some sleepiness initially when she woke up.  The pain was nonradiating.  She denies any shortness of breath.  She denies any risk factors for PE.  She recently had her menstruation.  She also reports having a little bit of vaginal discharge and concern that she might have yeast or BV.          History reviewed. No pertinent past medical history.  There are no problems to display for this patient.   Past Surgical History:  Procedure Laterality Date  . BREAST REDUCTION SURGERY    . KNEE SURGERY      Prior to Admission medications   Not on File    Allergies Mushroom extract complex  History reviewed. No pertinent family history.  Social History Social History   Tobacco Use  . Smoking status: Never Smoker  . Smokeless tobacco: Never Used  Substance Use Topics  . Alcohol use: Yes    Comment: occasionally  . Drug use: No      Review of Systems Constitutional: No fever/chills Eyes: No visual changes. ENT: No sore throat. Cardiovascular: Positive chest pain Respiratory: Denies shortness of breath. Gastrointestinal: No abdominal pain.  No nausea, no vomiting.  No diarrhea.  No constipation. Genitourinary: Negative for dysuria.  Vaginal discharge Musculoskeletal: Negative for back  pain. Skin: Negative for rash. Neurological: Negative for headaches, focal weakness or numbness. All other ROS negative ____________________________________________   PHYSICAL EXAM:  VITAL SIGNS: ED Triage Vitals [03/26/20 1154]  Enc Vitals Group     BP 120/64     Pulse Rate 83     Resp 16     Temp 98.3 F (36.8 C)     Temp Source Oral     SpO2 99 %     Weight 230 lb (104.3 kg)     Height 5\' 1"  (1.549 m)     Head Circumference      Peak Flow      Pain Score 5     Pain Loc      Pain Edu?      Excl. in GC?     Constitutional: Alert and oriented. Well appearing and in no acute distress. Eyes: Conjunctivae are normal. EOMI. Head: Atraumatic. Nose: No congestion/rhinnorhea. Mouth/Throat: Mucous membranes are moist.   Neck: No stridor. Trachea Midline. FROM Cardiovascular: Normal rate, regular rhythm. Grossly normal heart sounds.  Good peripheral circulation. Respiratory: Normal respiratory effort.  No retractions. Lungs CTAB. Gastrointestinal: Soft and nontender. No distention. No abdominal bruits.  Musculoskeletal: No lower extremity tenderness nor edema.  No joint effusions. Neurologic:  Normal speech and language. No gross focal neurologic deficits are appreciated.  Skin:  Skin is warm, dry and intact. No rash noted. Psychiatric: Mood and affect  are normal. Speech and behavior are normal. GU: Deferred   ____________________________________________   LABS (all labs ordered are listed, but only abnormal results are displayed)  Labs Reviewed  BASIC METABOLIC PANEL  CBC  POC URINE PREG, ED  TROPONIN I (HIGH SENSITIVITY)  TROPONIN I (HIGH SENSITIVITY)   ____________________________________________   ED ECG REPORT I, Concha Se, the attending physician, personally viewed and interpreted this ECG.  Normal sinus rate of 80, no ST elevation, no T wave inversions, normal intervals ____________________________________________  RADIOLOGY Vela Prose,  personally viewed and evaluated these images (plain radiographs) as part of my medical decision making, as well as reviewing the written report by the radiologist.  ED MD interpretation: No pneumonia  Official radiology report(s): DG Chest 2 View  Result Date: 03/26/2020 CLINICAL DATA:  Lethargy. EXAM: CHEST - 2 VIEW COMPARISON:  None. FINDINGS: The heart size and mediastinal contours are within normal limits. Both lungs are clear. The visualized skeletal structures are unremarkable. IMPRESSION: Normal chest x-ray. Electronically Signed   By: Rudie Meyer M.D.   On: 03/26/2020 13:50    ____________________________________________   PROCEDURES  Procedure(s) performed (including Critical Care):  Procedures   ____________________________________________   INITIAL IMPRESSION / ASSESSMENT AND PLAN / ED COURSE   [name] was evaluated in Emergency Department on 03/26/2020 for the symptoms described in the history of present illness. She was evaluated in the context of the global COVID-19 pandemic, which necessitated consideration that the patient might be at risk for infection with the SARS-CoV-2 virus that causes COVID-19. Institutional protocols and algorithms that pertain to the evaluation of patients at risk for COVID-19 are in a state of rapid change based on information released by regulatory bodies including the CDC and federal and state organizations. These policies and algorithms were followed during the patient's care in the ED.    Most Likely DDx:  -MSK (atypical chest pain) versus gastritis but will get cardiac markers to evaluate for ACS given risk factors/age   DDx that was also considered d/t potential to cause harm, but was found less likely based on history and physical (as detailed above): -PNA (no fevers, cough but CXR to evaluate) -PNX (reassured with equal b/l breath sounds, CXR to evaluate) -Symptomatic anemia (will get H&H) -Pulmonary embolism as no sob at  rest, not pleuritic in nature, no hypoxia -Aortic Dissection as no tearing pain and no radiation to the mid back, pulses equal -Pericarditis no rub on exam, EKG changes or hx to suggest dx -Tamponade (no notable SOB, tachycardic, hypotensive) -Esophageal rupture (no h/o diffuse vomitting/no crepitus)  Patient's labs are reassuring.  No evidence of anemia.  Cardiac markers negative and her symptom onset was greater than 3 hours ago and she is having a low heart score therefore will need 1 marker.  2:23 PM patient requesting testing for BV and yeast.  She declines pelvic exam stating that she would like to leave as fast as possible.  Offered patient to do a self swab.  She denies any pelvic pain to suggest PID.  Patient does not want to wait for the results.  She states that she will follow them up on my chart and will discuss the results with her primary care doctor.  Patient was discharged prior to results.  I discussed the provisional nature of ED diagnosis, the treatment so far, the ongoing plan of care, follow up appointments and return precautions with the patient and any family or support people present. They  expressed understanding and agreed with the plan, discharged home.   ____________________________________________   FINAL CLINICAL IMPRESSION(S) / ED DIAGNOSES   Final diagnoses:  Chest pain, unspecified type     MEDICATIONS GIVEN DURING THIS VISIT:  Medications - No data to display   ED Discharge Orders         Ordered    pantoprazole (PROTONIX) 20 MG tablet  Daily     Discontinue  Reprint     03/26/20 1423           Note:  This document was prepared using Dragon voice recognition software and may include unintentional dictation errors.   Concha Se, MD 03/27/20 1252

## 2020-03-26 NOTE — Discharge Instructions (Addendum)
Your chest x-ray was negative your heart marker was negative for heart attack.  Return the note caused this but it could be secondary to acid you could try an acid reducer and see if this helps prevent it from happening.  some of your tests are still pending and these need to be followed up on MyChart.  Return to ER if develop worsening shortness of breath or any other concerns

## 2020-05-29 ENCOUNTER — Other Ambulatory Visit: Payer: Self-pay

## 2020-05-29 ENCOUNTER — Ambulatory Visit
Admission: RE | Admit: 2020-05-29 | Discharge: 2020-05-29 | Disposition: A | Discharge status: Home / Self Care | Payer: BLUE CROSS/BLUE SHIELD | Source: Ambulatory Visit | Attending: Emergency Medicine | Admitting: Emergency Medicine

## 2020-05-29 VITALS — BP 113/81 | HR 73 | Temp 98.7°F | Resp 14

## 2020-05-29 DIAGNOSIS — N3001 Acute cystitis with hematuria: Secondary | ICD-10-CM | POA: Insufficient documentation

## 2020-05-29 LAB — POCT URINALYSIS DIP (MANUAL ENTRY)
Bilirubin, UA: NEGATIVE
Glucose, UA: NEGATIVE mg/dL
Ketones, POC UA: NEGATIVE mg/dL
Nitrite, UA: NEGATIVE
Protein Ur, POC: 30 mg/dL — AB
Spec Grav, UA: >=1.03 — AB (ref 1.010–1.025)
Urobilinogen, UA: 0.2 E.U./dL
pH, UA: 6 (ref 5.0–8.0)

## 2020-05-29 LAB — POCT URINE PREGNANCY: Preg Test, Ur: NEGATIVE

## 2020-05-29 MED ORDER — CEPHALEXIN 500 MG PO CAPS: 500.0000 mg | ORAL_CAPSULE | Freq: Two times a day (BID) | ORAL | 0 refills | Status: DC

## 2020-05-29 NOTE — ED Provider Notes (Signed)
[facility]    CSN: [id number] Arrival date & time: 05/29/20  0354      History   Chief Complaint Chief Complaint  Patient presents with  . Dysuria    HPI Judy Park is a 34 y.o. female  Presenting for possible UTI.  Endorsing 2-day course of dysuria without hematuria, pelvic or vaginal pain, vaginal discharge, abdominal pain back pain, fever.  Has not taken any for this.  Last UTI was about 2 years ago.  History reviewed. No pertinent past medical history.  There are no problems to display for this patient.   Past Surgical History:  Procedure Laterality Date  . BREAST REDUCTION SURGERY    . KNEE SURGERY      OB History   No obstetric history on file.      Home Medications    Prior to Admission medications   Medication Sig Start Date End Date Taking? Authorizing Provider  cephALEXin (KEFLEX) 500 MG capsule Take 1 capsule (500 mg total) by mouth 2 (two) times daily. 05/29/20   Hall-Potvin, [place], PA-C  pantoprazole (PROTONIX) 20 MG tablet Take 1 tablet (20 mg total) by mouth daily for 14 days. 03/26/20 04/09/20  Concha Se, MD    Family History History reviewed. No pertinent family history.  Social History Social History   Tobacco Use  . Smoking status: Never Smoker  . Smokeless tobacco: Never Used  Substance Use Topics  . Alcohol use: Yes    Comment: occasionally  . Drug use: No     Allergies   Mushroom extract complex   Review of Systems As per HPI   Physical Exam Triage Vital Signs ED Triage Vitals  Enc Vitals Group     BP 05/29/20 1015 113/81     Pulse Rate 05/29/20 1015 73     Resp 05/29/20 1015 14     Temp 05/29/20 1015 98.7 F (37.1 C)     Temp src --      SpO2 05/29/20 1015 98 %     Weight --      Height --      Head Circumference --      Peak Flow --      Pain Score 05/29/20 1013 8     Pain Loc --      Pain Edu? --      Excl. in GC? --    No data found.  Updated Vital Signs BP 113/81   Pulse 73    Temp 98.7 F (37.1 C)   Resp 14   LMP 04/10/2020 Comment: recent miscarriage  SpO2 98%   Visual Acuity Right Eye Distance:   Left Eye Distance:   Bilateral Distance:    Right Eye Near:   Left Eye Near:    Bilateral Near:     Physical Exam Constitutional:      General: She is not in acute distress. HENT:     Head: Normocephalic and atraumatic.  Eyes:     General: No scleral icterus.    Pupils: Pupils are equal, round, and reactive to light.  Cardiovascular:     Rate and Rhythm: Normal rate.  Pulmonary:     Effort: Pulmonary effort is normal.  Abdominal:     General: Bowel sounds are normal.     Palpations: Abdomen is soft.     Tenderness: There is no abdominal tenderness. There is no right CVA tenderness, left CVA tenderness or guarding.  Skin:    Coloration: Skin is  not jaundiced or pale.  Neurological:     Mental Status: She is alert and oriented to person, place, and time.      UC Treatments / Results  Labs (all labs ordered are listed, but only abnormal results are displayed) Labs Reviewed  POCT URINALYSIS DIP (MANUAL ENTRY) - Abnormal; Notable for the following components:      Result Value   Spec Grav, UA >=1.030 (*)    Blood, UA trace-intact (*)    Protein Ur, POC =30 (*)    Leukocytes, UA Small (1+) (*)    All other components within normal limits  URINE CULTURE  POCT URINE PREGNANCY    EKG   Radiology No results found.  Procedures Procedures (including critical care time)  Medications Ordered in UC Medications - No data to display  Initial Impression / Assessment and Plan / UC Course  I have reviewed the triage vital signs and the nursing notes.  Pertinent labs & imaging results that were available during my care of the patient were reviewed by me and considered in my medical decision making (see chart for details).     Urine pregnancy negative, urine dipstick with leukocytes, blood, protein.  Culture pending.  Will start Keflex.   Return precautions discussed, pt verbalized understanding and is agreeable to plan. Final Clinical Impressions(s) / UC Diagnoses   Final diagnoses:  Acute cystitis with hematuria     Discharge Instructions     Take antibiotic twice daily with food. Important to drink plenty of water throughout the day. Return for worsening urinary symptoms, blood in urine, abdominal or back pain, fever.    ED Prescriptions    Medication Sig Dispense Auth. Provider   cephALEXin (KEFLEX) 500 MG capsule Take 1 capsule (500 mg total) by mouth 2 (two) times daily. 10 capsule Hall-Potvin, [place], PA-C     PDMP not reviewed this encounter.   Hall-Potvin, [place], [place] 05/29/20 1101

## 2020-05-29 NOTE — ED Triage Notes (Signed)
Patient complains of dysuria x2 days.

## 2020-05-29 NOTE — Discharge Instructions (Signed)
Take antibiotic twice daily with food. Important to drink plenty of water throughout the day. Return for worsening urinary symptoms, blood in urine, abdominal or back pain, fever. 

## 2020-05-31 ENCOUNTER — Encounter: Payer: Self-pay | Admitting: Obstetrics and Gynecology

## 2020-05-31 LAB — URINE CULTURE: Culture: >=100000 — AB

## 2020-08-23 ENCOUNTER — Ambulatory Visit
Admission: EM | Admit: 2020-08-23 | Discharge: 2020-08-23 | Disposition: A | Discharge status: Home / Self Care | Payer: Self-pay

## 2020-08-23 ENCOUNTER — Encounter: Payer: Self-pay | Admitting: Emergency Medicine

## 2020-08-23 ENCOUNTER — Ambulatory Visit
Admission: EM | Admit: 2020-08-23 | Discharge: 2020-08-23 | Disposition: A | Discharge status: Home / Self Care | Payer: BC Managed Care – PPO | Attending: Family Medicine | Admitting: Family Medicine

## 2020-08-23 DIAGNOSIS — M25562 Pain in left knee: Secondary | ICD-10-CM | POA: Insufficient documentation

## 2020-08-23 DIAGNOSIS — M25561 Pain in right knee: Secondary | ICD-10-CM | POA: Insufficient documentation

## 2020-08-23 DIAGNOSIS — Z113 Encounter for screening for infections with a predominantly sexual mode of transmission: Secondary | ICD-10-CM | POA: Insufficient documentation

## 2020-08-23 DIAGNOSIS — N760 Acute vaginitis: Secondary | ICD-10-CM

## 2020-08-23 DIAGNOSIS — Z79899 Other long term (current) drug therapy: Secondary | ICD-10-CM | POA: Insufficient documentation

## 2020-08-23 MED ORDER — FLUCONAZOLE 150 MG PO TABS
150.0000 mg | ORAL_TABLET | Freq: Every day | ORAL | 0 refills | Status: DC
Start: 2020-08-23 — End: 2020-10-31

## 2020-08-23 MED ORDER — MELOXICAM 7.5 MG PO TABS
7.5000 mg | ORAL_TABLET | Freq: Every day | ORAL | 0 refills | Status: DC
Start: 2020-08-23 — End: 2020-10-31

## 2020-08-23 NOTE — Discharge Instructions (Addendum)
Take the meloxicam daily for pain, inflammation and swelling.  You can purchase a knee sleeve over-the-counter to help support.  Rest, ice, elevate. Diflucan to treat for yeast infection. We will call you with any positive results of your swab.

## 2020-08-23 NOTE — ED Triage Notes (Signed)
Pt has been having knee pain.  She now works at [employer] and its hard on her knees.  Wants a note for light duty. Also has been having itching for about 4-5 days.  Wants to be checked for yeast infection and BV.

## 2020-08-23 NOTE — ED Provider Notes (Signed)
[facility]    CSN: [id number] Arrival date & time: 08/23/20  1536      History   Chief Complaint Chief Complaint  Patient presents with  . Knee Pain    HPI Judy Park is a 34 y.o. female.   Patient is a 34 year old female who presents today with knee pain.  This is bilateral but worse on the left.  Generalized knee pain.  Does a lot of standing, walking and stooping and heavy lifting at work.  Has not tried any for symptoms.  Denies any significant swelling, erythema of the knee.  No history of blood clots.  No injuries or falls. She is also having some thick, white/yellow vaginal discharge with itching and irritation to the vaginal area.  Convinced of yeast infection at this time.  Also has history of BV and would like to be checked.      History reviewed. No pertinent past medical history.  There are no problems to display for this patient.   Past Surgical History:  Procedure Laterality Date  . BREAST REDUCTION SURGERY    . KNEE SURGERY      OB History   No obstetric history on file.      Home Medications    Prior to Admission medications   Medication Sig Start Date End Date Taking? Authorizing Provider  cephALEXin (KEFLEX) 500 MG capsule Take 1 capsule (500 mg total) by mouth 2 (two) times daily. 05/29/20   Hall-Potvin, [place], PA-C  fluconazole (DIFLUCAN) 150 MG tablet Take 1 tablet (150 mg total) by mouth daily. 08/23/20   Dahlia Byes A, NP  meloxicam (MOBIC) 7.5 MG tablet Take 1 tablet (7.5 mg total) by mouth daily. 08/23/20   Dahlia Byes A, NP  pantoprazole (PROTONIX) 20 MG tablet Take 1 tablet (20 mg total) by mouth daily for 14 days. 03/26/20 04/09/20  Concha Se, MD    Family History No family history on file.  Social History Social History   Tobacco Use  . Smoking status: Never Smoker  . Smokeless tobacco: Never Used  Substance Use Topics  . Alcohol use: Yes    Comment: occasionally  . Drug use: No     Allergies    Mushroom extract complex   Review of Systems Review of Systems   Physical Exam Triage Vital Signs ED Triage Vitals [08/23/20 1612]  Enc Vitals Group     BP 128/84     Pulse Rate 72     Resp 18     Temp 97.9 F (36.6 C)     Temp Source Oral     SpO2 98 %     Weight 250 lb (113.4 kg)     Height      Head Circumference      Peak Flow      Pain Score 0     Pain Loc      Pain Edu?      Excl. in GC?    No data found.  Updated Vital Signs BP 128/84 (BP Location: Left Arm)   Pulse 72   Temp 97.9 F (36.6 C) (Oral)   Resp 18   Wt 250 lb (113.4 kg)   LMP 08/05/2020   SpO2 98%   BMI 47.24 kg/m   Visual Acuity Right Eye Distance:   Left Eye Distance:   Bilateral Distance:    Right Eye Near:   Left Eye Near:    Bilateral Near:     Physical Exam Vitals  and nursing note reviewed.  Constitutional:      General: She is not in acute distress.    Appearance: Normal appearance. She is not ill-appearing, toxic-appearing or diaphoretic.  HENT:     Head: Normocephalic.     Nose: Nose normal.  Eyes:     Conjunctiva/sclera: Conjunctivae normal.  Pulmonary:     Effort: Pulmonary effort is normal.  Musculoskeletal:        General: Tenderness present. Normal range of motion.     Cervical back: Normal range of motion.     Comments: Generalized tenderness to the left knee.  Normal range of motion. No calf pain or swelling  Skin:    General: Skin is warm and dry.     Findings: No rash.  Neurological:     Mental Status: She is alert.  Psychiatric:        Mood and Affect: Mood normal.      UC Treatments / Results  Labs (all labs ordered are listed, but only abnormal results are displayed) Labs Reviewed  CERVICOVAGINAL ANCILLARY ONLY    EKG   Radiology No results found.  Procedures Procedures (including critical care time)  Medications Ordered in UC Medications - No data to display  Initial Impression / Assessment and Plan / UC Course  I have reviewed  the triage vital signs and the nursing notes.  Pertinent labs & imaging results that were available during my care of the patient were reviewed by me and considered in my medical decision making (see chart for details).     Knee pain Most likely related to arthritis or muscle strain.  Will have her rest, ice, elevate, wear knee sleeve.  Meloxicam for pain and inflammation. Plans to follow-up with orthopedic specialist for continued problems  Vaginitis Patient believes she has a yeast infection we will go ahead and treat for yeast infection today.  Swab sent for further testing. Follow up as needed for continued or worsening symptoms  Final Clinical Impressions(s) / UC Diagnoses   Final diagnoses:  Vaginitis and vulvovaginitis  Acute pain of left knee     Discharge Instructions     Take the meloxicam daily for pain, inflammation and swelling.  You can purchase a knee sleeve over-the-counter to help support.  Rest, ice, elevate. Diflucan to treat for yeast infection. We will call you with any positive results of your swab.    ED Prescriptions    Medication Sig Dispense Auth. Provider   meloxicam (MOBIC) 7.5 MG tablet Take 1 tablet (7.5 mg total) by mouth daily. 30 tablet Jerron Niblack A, NP   fluconazole (DIFLUCAN) 150 MG tablet Take 1 tablet (150 mg total) by mouth daily. 2 tablet Dahlia Byes A, NP     PDMP not reviewed this encounter.   Janace Aris, NP 08/24/20 0740

## 2020-08-25 LAB — CERVICOVAGINAL ANCILLARY ONLY
Bacterial Vaginitis (gardnerella): NEGATIVE
Chlamydia: NEGATIVE
Comment: NEGATIVE
Comment: NEGATIVE
Comment: NEGATIVE
Comment: NORMAL
Neisseria Gonorrhea: NEGATIVE
Trichomonas: NEGATIVE

## 2020-10-31 ENCOUNTER — Encounter (HOSPITAL_COMMUNITY): Payer: Self-pay | Admitting: Emergency Medicine

## 2020-10-31 ENCOUNTER — Ambulatory Visit (HOSPITAL_COMMUNITY)
Admission: EM | Admit: 2020-10-31 | Discharge: 2020-10-31 | Disposition: A | Discharge status: Home / Self Care | Payer: BC Managed Care – PPO

## 2020-10-31 ENCOUNTER — Other Ambulatory Visit: Payer: Self-pay

## 2020-10-31 DIAGNOSIS — G43709 Chronic migraine without aura, not intractable, without status migrainosus: Secondary | ICD-10-CM

## 2020-10-31 HISTORY — DX: Compression of brain: G93.5

## 2020-10-31 MED ORDER — SUMATRIPTAN SUCCINATE 25 MG PO TABS
25.0000 mg | ORAL_TABLET | Freq: Once | ORAL | 0 refills | Status: DC
Start: 2020-10-31 — End: 2021-01-30

## 2020-10-31 NOTE — ED Provider Notes (Signed)
[facility]    CSN: [id number] Arrival date & time: 10/31/20  7342      History   Chief Complaint Chief Complaint  Patient presents with  . Blurred Vision    HPI Judy Park is a 35 y.o. female.   Patient presents with frontal headache, dizziness, tingling in left arm, nausea, diarrhea, starting three days ago. Headache is 7/10 worsened by coughing, laughing and bending over. Associated colorful floaters. Denies blurred vision, photophobia speech changes, difficulty swallowing, motor deficits, urinary changes. Has history of Chiari malformation. Last seen neurologist in 2019. Tested for COVID- negative.   Past Medical History:  Diagnosis Date  . Chiari I malformation (HCC)     There are no problems to display for this patient.   Past Surgical History:  Procedure Laterality Date  . BREAST REDUCTION SURGERY    . KNEE SURGERY      OB History   No obstetric history on file.      Home Medications    Prior to Admission medications   Medication Sig Start Date End Date Taking? Authorizing Provider  amphetamine-dextroamphetamine (ADDERALL XR) 30 MG 24 hr capsule Take by mouth.    [provider]  cephALEXin (KEFLEX) 500 MG capsule Take 1 capsule (500 mg total) by mouth 2 (two) times daily. 05/29/20   Hall-Potvin, [place], PA-C  fluconazole (DIFLUCAN) 150 MG tablet Take 1 tablet (150 mg total) by mouth daily. 08/23/20   Dahlia Byes A, NP  meloxicam (MOBIC) 7.5 MG tablet Take 1 tablet (7.5 mg total) by mouth daily. 08/23/20   Dahlia Byes A, NP  pantoprazole (PROTONIX) 20 MG tablet Take 1 tablet (20 mg total) by mouth daily for 14 days. 03/26/20 04/09/20  Concha Se, MD    Family History History reviewed. No pertinent family history.  Social History Social History   Tobacco Use  . Smoking status: Never Smoker  . Smokeless tobacco: Never Used  Substance Use Topics  . Alcohol use: Yes    Comment: occasionally  . Drug use: No     Allergies    Mushroom extract complex   Review of Systems Review of Systems  Constitutional: Negative.   HENT: Negative.   Eyes: Negative.   Respiratory: Negative.   Cardiovascular: Negative.   Gastrointestinal: Positive for diarrhea and nausea. Negative for abdominal distention, abdominal pain, anal bleeding, blood in stool, constipation, rectal pain and vomiting.  Genitourinary: Negative.   Musculoskeletal: Positive for neck stiffness. Negative for arthralgias, back pain, gait problem, joint swelling, myalgias and neck pain.  Skin: Negative.   Neurological: Positive for dizziness and headaches. Negative for tremors, seizures, syncope, facial asymmetry, speech difficulty, weakness, light-headedness and numbness.  Psychiatric/Behavioral: Negative.      Physical Exam Triage Vital Signs ED Triage Vitals  Enc Vitals Group     BP 10/31/20 0938 (!) 114/59     Pulse Rate 10/31/20 0938 65     Resp 10/31/20 0938 (!) 21     Temp 10/31/20 0938 98.7 F (37.1 C)     Temp Source 10/31/20 0938 Oral     SpO2 10/31/20 0938 100 %     Weight --      Height --      Head Circumference --      Peak Flow --      Pain Score 10/31/20 0932 8     Pain Loc --      Pain Edu? --      Excl. in GC? --  No data found.  Updated Vital Signs BP (!) 114/59 (BP Location: Right Arm) Comment (BP Location): large cuff  Pulse 65   Temp 98.7 F (37.1 C) (Oral)   Resp (!) 21   LMP 10/31/2020   SpO2 100%   Visual Acuity Right Eye Distance:   Left Eye Distance:   Bilateral Distance:    Right Eye Near:   Left Eye Near:    Bilateral Near:     Physical Exam Constitutional:      Appearance: Normal appearance. She is obese.  HENT:     Head: Normocephalic.  Eyes:     Extraocular Movements: Extraocular movements intact.     Conjunctiva/sclera: Conjunctivae normal.     Pupils: Pupils are equal, round, and reactive to light.  Cardiovascular:     Rate and Rhythm: Normal rate and regular rhythm.     Pulses:  Normal pulses.     Heart sounds: Normal heart sounds.  Pulmonary:     Effort: Pulmonary effort is normal.     Breath sounds: Normal breath sounds.  Musculoskeletal:        General: Normal range of motion.     Cervical back: Normal range of motion.  Skin:    General: Skin is warm and dry.  Neurological:     General: No focal deficit present.     Mental Status: She is alert and oriented to person, place, and time. Mental status is at baseline.     Cranial Nerves: No cranial nerve deficit or facial asymmetry.     Sensory: No sensory deficit.     Motor: No weakness or tremor.     Coordination: Coordination normal.     Gait: Gait normal.  Psychiatric:        Mood and Affect: Mood normal.        Behavior: Behavior normal.        Thought Content: Thought content normal.        Judgment: Judgment normal.      UC Treatments / Results  Labs (all labs ordered are listed, but only abnormal results are displayed) Labs Reviewed - No data to display  EKG   Radiology No results found.  Procedures Procedures (including critical care time)  Medications Ordered in UC Medications - No data to display  Initial Impression / Assessment and Plan / UC Course  I have reviewed the triage vital signs and the nursing notes.  Pertinent labs & imaging results that were available during my care of the patient were reviewed by me and considered in my medical decision making (see chart for details).  Migraine without aura  1. Imitrex 25 mg once then every two hours as needed 2. Patient in process of establishing primary care and with neurologist  Final Clinical Impressions(s) / UC Diagnoses   Final diagnoses:  None   Discharge Instructions   None    ED Prescriptions    None     PDMP not reviewed this encounter.   Valinda Hoar, NP 10/31/20 1050

## 2020-10-31 NOTE — Discharge Instructions (Addendum)
Take one pill then can repeat every two hours if headache persist   Follow up with Urgent Care for worsening symptoms or if new symptoms present

## 2020-10-31 NOTE — ED Notes (Signed)
Spoke to providers, patient is completing registration process to be seen here

## 2020-10-31 NOTE — ED Triage Notes (Addendum)
Blurred vision or more specifically seeing "spots and dots" in both eyes started about 3 days ago.  Patient has a history of the same.  This is the second episode .  Numbness in left arm, extreme headache, and imbalance.  All complaints were typical of first episode.   Patient has a diagnosis of chiari malformation.   Patient does not have a pcp locally

## 2021-01-11 ENCOUNTER — Telehealth: Payer: Self-pay | Admitting: *Deleted

## 2021-01-11 NOTE — Telephone Encounter (Signed)
R/c notes from Dr Marva Panda office.

## 2021-01-22 ENCOUNTER — Telehealth: Payer: Self-pay | Admitting: Neurology

## 2021-01-22 NOTE — Telephone Encounter (Signed)
Pt's appointment on 5/17 cancelled in error, please confirm w pt that she is scheduled for that day if she calls back.

## 2021-01-29 ENCOUNTER — Encounter: Payer: Self-pay | Admitting: *Deleted

## 2021-01-30 ENCOUNTER — Other Ambulatory Visit: Payer: Self-pay

## 2021-01-30 ENCOUNTER — Ambulatory Visit: Payer: BC Managed Care – PPO | Admitting: Neurology

## 2021-01-30 ENCOUNTER — Telehealth: Payer: Self-pay | Admitting: Neurology

## 2021-01-30 ENCOUNTER — Encounter: Payer: Self-pay | Admitting: Neurology

## 2021-01-30 VITALS — BP 106/74 | HR 73 | Ht 61.0 in | Wt 253.0 lb

## 2021-01-30 DIAGNOSIS — H539 Unspecified visual disturbance: Secondary | ICD-10-CM | POA: Diagnosis not present

## 2021-01-30 DIAGNOSIS — G43009 Migraine without aura, not intractable, without status migrainosus: Secondary | ICD-10-CM | POA: Diagnosis not present

## 2021-01-30 DIAGNOSIS — R202 Paresthesia of skin: Secondary | ICD-10-CM

## 2021-01-30 DIAGNOSIS — G5602 Carpal tunnel syndrome, left upper limb: Secondary | ICD-10-CM | POA: Diagnosis not present

## 2021-01-30 DIAGNOSIS — G935 Compression of brain: Secondary | ICD-10-CM

## 2021-01-30 DIAGNOSIS — R519 Headache, unspecified: Secondary | ICD-10-CM

## 2021-01-30 DIAGNOSIS — R200 Anesthesia of skin: Secondary | ICD-10-CM

## 2021-01-30 DIAGNOSIS — R51 Headache with orthostatic component, not elsewhere classified: Secondary | ICD-10-CM

## 2021-01-30 MED ORDER — ONDANSETRON 4 MG PO TBDP: 4.0000 mg | ORAL_TABLET | Freq: Three times a day (TID) | ORAL | 3 refills | Status: DC | PRN

## 2021-01-30 MED ORDER — RIZATRIPTAN BENZOATE 10 MG PO TBDP: 10.0000 mg | ORAL_TABLET | ORAL | 11 refills | Status: DC | PRN

## 2021-01-30 NOTE — Progress Notes (Signed)
[facility]    Provider:  Dr Lucia GaskinsAhern Requesting Provider: Lewis Moccasinewey, Elizabeth R, MD Primary Care Provider:  Patient, No Pcp Per (Inactive)  CC:  Chiari malformation  HPI:  Judy Park is a 35 y.o. female here as requested by Lewis Moccasinewey, Elizabeth R, MD for chiari malformation.  I reviewed Dr. Chauncy Passyewey's records: In 2018 she was diagnosed by CAT scan with Chiari malformation due to having recurrent headaches head pain along with problems with balance, [place], she tried some meds cannot recall, aggravating factors include movement especially bending over and standing upright, rapid movement, consistent movement, coughing, straining, no loss of bowel or bladder function, no flares in 2 years until February 2022, she was seen at urgent care at that time.  I also [facility]: She presented in February 2019 with an onset of 6 months of headaches constantly, location entire head and parietal left, throbbing, associated symptoms included nausea phonophobia and photophobia, daily headaches, no preceding episodes, holocephalic but tend to be worse on the left side, also numbness and tingling associated with the headaches, worse with changing positions, her symptoms were suggestive of migraines as well as evidence of Chiari on recent MRI of the brain which could be contributing she was sent to ophthalmology for evaluation of papilledema.  MRI of the cervical spine was also ordered to evaluate for any syrinx that may be associated.  Physician favored migraine variant.  At follow-up appointment the headache had improved on topiramate, she saw ophthalmology with reportedly normal ocular exam, MRI of the cervical spine and MRI of the brain were unrevealing.  No evidence of syrinx on MRI of the cervical spine.  Topamax was prescribed as 50 mg twice daily.  The left side of her hand is always numb, worse at night, wakes her up, also is  hairdresser and hurts at work, she had carpal tunnel syndrome diagnosed in 2019 by emg/ncs. She has migraines, ringing in the ears, she is having vision changes blurry vision, right eye feels worse with vision changes, she is always tired. Headaches are in the temples an din the front, pulsating/pounding/throbbing, sound sensitivity, a dark room helps. She is having the headache every other week, for a day, she wakes up with a headache but not severe. Worse with bending. Neck pain. She has imbalance especially with movement of the head flex/ex, she has nausea in general (not just with headaches), she is a hair dresser and having difficulty (more the left hand due to the numbness) and she is dropping things out of the left hand. She may sometimes feel like "she takes a second swallow" but no gagging or coughing on food. No other focal neurologic deficits, associated symptoms, inciting events or modifiable factors.  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  No suggestion of cervical radiculopathy or polyneuropathy.  I reviewed MRA of the brain report which was "no abnormality identified an MRI of the head without contrast".  November 26, 2017.  MRI of the brain January 2019: Reviewed report "borderline low positions inferior cerebellar tonsils, ventricles sulci and basal cisterns are normal to size and shape for patient's age, no focal areas of significant parenchymal T2 signal abnormality noted, specifically no restricted diffusion to suggest acute subacute infarction, no hemorrhage or mass,  flow-void identified in the internal carotid and vertebrobasilar arteries" impression: "Mild Arnold-Chiari type I malformation, otherwise normal MRI of the brain, no hemorrhage mass or evidence of acute to  subacute infarction"  I reviewed MRI of the cervical spine report November 26, 2017: Unremarkable cervical spine MRI without contrast specifically no significant central canal or neural foraminal stenosis.".  Every level appeared normal by my review, no syrinx.  Review of Systems: Patient complains of symptoms per HPI as well as the following symptoms: headaches. Pertinent negatives and positives per HPI. All others negative.   Social History   Socioeconomic History  . Marital status: Single    Spouse name: Not on file  . Number of children: Not on file  . Years of education: Not on file  . Highest education level: Not on file  Occupational History  . Not on file  Tobacco Use  . Smoking status: Never Smoker  . Smokeless tobacco: Never Used  Substance and Sexual Activity  . Alcohol use: Yes    Comment: occasionally  . Drug use: No  . Sexual activity: Yes    Birth control/protection: None  Other Topics Concern  . Not on file  Social History Narrative  . Not on file   Social Determinants of Health   Financial Resource Strain: Not on file  Food Insecurity: Not on file  Transportation Needs: Not on file  Physical Activity: Not on file  Stress: Not on file  Social Connections: Not on file  Intimate Partner Violence: Not on file    Family History  Problem Relation Age of Onset  . Diabetes Mother   . High blood pressure Mother   . Congestive Heart Failure Mother   . Drug abuse Mother        heroin addiction  . Cancer Other        maternal side   . Chiari malformation Neg Hx     Past Medical History:  Diagnosis Date  . ADD (attention deficit disorder)   . Chiari I malformation (HCC)   . Migraine     Patient Active Problem List   Diagnosis Date Noted  . Migraine without aura and without status migrainosus, not intractable 01/30/2021  . Carpal tunnel syndrome of left wrist 01/30/2021  . Chiari malformation type I (HCC) 01/30/2021    Past Surgical History:   Procedure Laterality Date  . BREAST REDUCTION SURGERY    . KNEE SURGERY      Current Outpatient Medications  Medication Sig Dispense Refill  . amphetamine-dextroamphetamine (ADDERALL XR) 30 MG 24 hr capsule Take by mouth.    . ondansetron (ZOFRAN-ODT) 4 MG disintegrating tablet Take 1-2 tablets (4-8 mg total) by mouth every 8 (eight) hours as needed. May take with Rizatriptan for migraine or take for general nausea. 30 tablet 3  . rizatriptan (MAXALT-MLT) 10 MG disintegrating tablet Take 1 tablet (10 mg total) by mouth as needed for migraine. May repeat in 2 hours if needed 9 tablet 11   No current facility-administered medications for this visit.    Allergies as of 01/30/2021 - Review Complete 01/30/2021  Allergen Reaction Noted  . Mushroom extract complex  03/26/2020    Vitals: BP 106/74 (BP Location: Right Arm, Patient Position: Sitting, Cuff Size: Large)   Pulse 73   Ht 5\' 1"  (1.549 m)   Wt 253 lb (114.8 kg)   BMI 47.80 kg/m  Last Weight:  Wt Readings from Last 1 Encounters:  01/30/21 253 lb (114.8 kg)   Last  Height:   Ht Readings from Last 1 Encounters:  01/30/21 5\' 1"  (1.549 m)     Physical exam: Exam: Gen: NAD, conversant, well nourised, obese, well groomed                     CV: RRR, no MRG. No Carotid Bruits. No peripheral edema, warm, nontender Eyes: Conjunctivae clear without exudates or hemorrhage  Neuro: Detailed Neurologic Exam  Speech:    Speech is normal; fluent and spontaneous with normal comprehension.  Cognition:    The patient is oriented to person, place, and time;     recent and remote memory intact;     language fluent;     normal attention, concentration,     fund of knowledge Cranial Nerves:    The pupils are equal, round, and reactive to light. The fundi are flat. Visual fields are full to finger confrontation. Extraocular movements are intact. Trigeminal sensation is intact and the muscles of mastication are normal. The face is  symmetric. The palate elevates in the midline. Hearing intact. Voice is normal. Shoulder shrug is normal. The tongue has normal motion without fasciculations.   Coordination:    Normal finger to nose  Gait:    Normal native gait, slightly wide based due to large body habitus  Motor Observation:    No asymmetry, no atrophy, and no involuntary movements noted. Tone:    Normal muscle tone.    Posture:    Posture is normal. normal erect    Strength:    Strength is V/V in the upper and lower limbs.      Sensation: intact to LT     Reflex Exam:  DTR's:    Deep tendon reflexes in the upper and lower extremities are normal bilaterally.   Toes:    The toes are downgoing bilaterally.   Clonus:    Clonus is absent.    Assessment/Plan:  Patient with migraines, chiari malformation, CTS  The left side of her hand is always numb, she had carpal tunnel syndrome diagnosed in 2019 by emg/ncs, hand surgery referral. Will send the emg/ncs with the referral but also gave a copy to patient to bring with her.  Migraines: 2x a month. She declines preventative (did well on topamax in the past). Rizatriptan and Ondansetron acutely.  Chiari: Repeat MRI of the brain to follow due to symptoms of vision changes, dizziness, imbalance, numbness of the hands, vision changes.MRI c-spine and MRA of the head in the past were unremarkable (no syrinx)  Morning headaches and fatigue: Consider sleep evaluation. She declines at this time.   Bloodwork. Follow up with Dr. 2020.   Discussed: To prevent or relieve headaches, try the following: Cool Compress. Lie down and place a cool compress on your head.  Avoid headache triggers. If certain foods or odors seem to have triggered your migraines in the past, avoid them. A headache diary might help you identify triggers.  Include physical activity in your daily routine. Try a daily walk or other moderate aerobic exercise.  Manage stress. Find healthy ways to cope  with the stressors, such as delegating tasks on your to-do list.  Practice relaxation techniques. Try deep breathing, yoga, massage and visualization.  Eat regularly. Eating regularly scheduled meals and maintaining a healthy diet might help prevent headaches. Also, drink plenty of fluids.  Follow a regular sleep schedule. Sleep deprivation might contribute to headaches Consider biofeedback. With this mind-body technique, you learn to control certain bodily functions --  such as muscle tension, heart rate and blood pressure -- to prevent headaches or reduce headache pain.    Proceed to emergency room if you experience new or worsening symptoms or symptoms do not resolve, if you have new neurologic symptoms or if headache is severe, or for any concerning symptom.   Provided education and documentation from [employer] headache [employer] toolbox including articles on: chronic migraine medication overuse headache, chronic migraines, prevention of migraines, behavioral and other nonpharmacologic treatments for headache.   Orders Placed This Encounter  Procedures  . CBC with Differential/Platelets  . Comprehensive metabolic panel  . TSH  . Ambulatory referral to Hand Surgery   Meds ordered this encounter  Medications  . rizatriptan (MAXALT-MLT) 10 MG disintegrating tablet    Sig: Take 1 tablet (10 mg total) by mouth as needed for migraine. May repeat in 2 hours if needed    Dispense:  9 tablet    Refill:  11  . ondansetron (ZOFRAN-ODT) 4 MG disintegrating tablet    Sig: Take 1-2 tablets (4-8 mg total) by mouth every 8 (eight) hours as needed. May take with Rizatriptan for migraine or take for general nausea.    Dispense:  30 tablet    Refill:  3    Cc: Lewis Moccasin, MD,  Patient, No Pcp Per (Inactive)  Judy Dean, MD  [facility] [address] [place], [place] [address]  Phone [phone] Fax [phone]

## 2021-01-30 NOTE — Telephone Encounter (Signed)
Sent referral via fax to [facility] [place]. Phone: [phone]. Fax: [phone]. Included NCV/EMG results from [facility].

## 2021-01-30 NOTE — Patient Instructions (Addendum)
- MRI of the brain - Blood work today - Rizatriptan: Please take one tablet at the onset of your headache. If it does not improve the symptoms please take one additional tablet. Do not take more then 2 tablets in 24hrs. Do not take use more then 2 to 3 times in a week. - Nausea: Ondansetron - Hand surgeon for the Carpal Tunnel Syndrome   Carpal Tunnel Syndrome  Carpal tunnel syndrome is a condition that causes pain, numbness, and weakness in your hand and fingers. The carpal tunnel is a narrow area located on the palm side of your wrist. Repeated wrist motion or certain diseases may cause swelling within the tunnel. This swelling pinches the main nerve in the wrist. The main nerve in the wrist is called the median nerve. What are the causes? This condition may be caused by:  Repeated and forceful wrist and hand motions.  Wrist injuries.  Arthritis.  A cyst or tumor in the carpal tunnel.  Fluid buildup during pregnancy.  Use of tools that vibrate. Sometimes the cause of this condition is not known. What increases the risk? The following factors may make you more likely to develop this condition:  Having a job that requires you to repeatedly or forcefully move your wrist or hand or requires you to use tools that vibrate. This may include jobs that involve using computers, working on an [employer], or working with power tools such as Radiographer, therapeutic.  Being a woman.  Having certain conditions, such as: ? Diabetes. ? Obesity. ? An underactive thyroid (hypothyroidism). ? Kidney failure. ? Rheumatoid arthritis. What are the signs or symptoms? Symptoms of this condition include:  A tingling feeling in your fingers, especially in your thumb, index, and middle fingers.  Tingling or numbness in your hand.  An aching feeling in your entire arm, especially when your wrist and elbow are bent for a long time.  Wrist pain that goes up your arm to your shoulder.  Pain that goes  down into your palm or fingers.  A weak feeling in your hands. You may have trouble grabbing and holding items. Your symptoms may feel worse during the night. How is this diagnosed? This condition is diagnosed with a medical history and physical exam. You may also have tests, including:  Electromyogram (EMG). This test measures electrical signals sent by your nerves into the muscles.  Nerve conduction study. This test measures how well electrical signals pass through your nerves.  Imaging tests, such as X-rays, ultrasound, and MRI. These tests check for possible causes of your condition. How is this treated? This condition may be treated with:  Lifestyle changes. It is important to stop or change the activity that caused your condition.  Doing exercise and activities to strengthen and stretch your muscles and tendons (physical therapy).  Making lifestyle changes to help with your condition and learning how to do your daily activities safely (occupational therapy).  Medicines for pain and inflammation. This may include medicine that is injected into your wrist.  A wrist splint or brace.  Surgery. Follow these instructions at home: If you have a splint or brace:  Wear the splint or brace as told by your health care provider. Remove it only as told by your health care provider.  Loosen the splint or brace if your fingers tingle, become numb, or turn cold and blue.  Keep the splint or brace clean.  If the splint or brace is not waterproof: ? Do not let  it get wet. ? Cover it with a watertight covering when you take a bath or shower. Managing pain, stiffness, and swelling If directed, put ice on the painful area. To do this:  If you have a removeable splint or brace, remove it as told by your health care provider.  Put ice in a plastic bag.  Place a towel between your skin and the bag or between the splint or brace and the bag.  Leave the ice on for 20 minutes, 2-3 times a  day. Do not fall asleep with the cold pack on your skin.  Remove the ice if your skin turns bright red. This is very important. If you cannot feel pain, heat, or cold, you have a greater risk of damage to the area. Move your fingers often to reduce stiffness and swelling.   General instructions  Take over-the-counter and prescription medicines only as told by your health care provider.  Rest your wrist and hand from any activity that may be causing your pain. If your condition is work related, talk with your employer about changes that can be made, such as getting a wrist pad to use while typing.  Do any exercises as told by your health care provider, physical therapist, or occupational therapist.  Keep all follow-up visits. This is important. Contact a health care provider if:  You have new symptoms.  Your pain is not controlled with medicines.  Your symptoms get worse. Get help right away if:  You have severe numbness or tingling in your wrist or hand. Summary  Carpal tunnel syndrome is a condition that causes pain, numbness, and weakness in your hand and fingers.  It is usually caused by repeated wrist motions.  Lifestyle changes and medicines are used to treat carpal tunnel syndrome. Surgery may be recommended.  Follow your health care provider's instructions about wearing a splint, resting from activity, keeping follow-up visits, and calling for help. This information is not intended to replace advice given to you by your health care provider. Make sure you discuss any questions you have with your health care provider. Document Revised: 01/13/2020 Document Reviewed: 01/13/2020 Elsevier Patient Education  2021 Elsevier Inc.  Migraine Headache A migraine headache is an intense, throbbing pain on one side or both sides of the head. Migraine headaches may also cause other symptoms, such as nausea, vomiting, and sensitivity to light and noise. A migraine headache can last from 4  hours to 3 days. Talk with your doctor about what things may bring on (trigger) your migraine headaches. What are the causes? The exact cause of this condition is not known. However, a migraine may be caused when nerves in the brain become irritated and release chemicals that cause inflammation of blood vessels. This inflammation causes pain. This condition may be triggered or caused by:  Drinking alcohol.  Smoking.  Taking medicines, such as: ? Medicine used to treat chest pain (nitroglycerin). ? Birth control pills. ? Estrogen. ? Certain blood pressure medicines.  Eating or drinking products that contain nitrates, glutamate, aspartame, or tyramine. Aged cheeses, chocolate, or caffeine may also be triggers.  Doing physical activity. Other things that may trigger a migraine headache include:  Menstruation.  Pregnancy.  Hunger.  Stress.  Lack of sleep or too much sleep.  Weather changes.  Fatigue. What increases the risk? The following factors may make you more likely to experience migraine headaches:  Being a certain age. This condition is more common in people who are 4-67 years old.  Being female.  Having a family history of migraine headaches.  Being Caucasian.  Having a mental health condition, such as depression or anxiety.  Being obese. What are the signs or symptoms? The main symptom of this condition is pulsating or throbbing pain. This pain may:  Happen in any area of the head, such as on one side or both sides.  Interfere with daily activities.  Get worse with physical activity.  Get worse with exposure to bright lights or loud noises. Other symptoms may include:  Nausea.  Vomiting.  Dizziness.  General sensitivity to bright lights, loud noises, or smells. Before you get a migraine headache, you may get warning signs (an aura). An aura may include:  Seeing flashing lights or having blind spots.  Seeing bright spots, halos, or zigzag  lines.  Having tunnel vision or blurred vision.  Having numbness or a tingling feeling.  Having trouble talking.  Having muscle weakness. Some people have symptoms after a migraine headache (postdromal phase), such as:  Feeling tired.  Difficulty concentrating. How is this diagnosed? A migraine headache can be diagnosed based on:  Your symptoms.  A physical exam.  Tests, such as: ? CT scan or an MRI of the head. These imaging tests can help rule out other causes of headaches. ? Taking fluid from the spine (lumbar puncture) and analyzing it (cerebrospinal fluid analysis, or CSF analysis). How is this treated? This condition may be treated with medicines that:  Relieve pain.  Relieve nausea.  Prevent migraine headaches. Treatment for this condition may also include:  Acupuncture.  Lifestyle changes like avoiding foods that trigger migraine headaches.  Biofeedback.  Cognitive behavioral therapy. Follow these instructions at home: Medicines  Take over-the-counter and prescription medicines only as told by your health care provider.  Ask your health care provider if the medicine prescribed to you: ? Requires you to avoid driving or using heavy machinery. ? Can cause constipation. You may need to take these actions to prevent or treat constipation:  Drink enough fluid to keep your urine pale yellow.  Take over-the-counter or prescription medicines.  Eat foods that are high in fiber, such as beans, whole grains, and fresh fruits and vegetables.  Limit foods that are high in fat and processed sugars, such as fried or sweet foods. Lifestyle  Do not drink alcohol.  Do not use any products that contain nicotine or tobacco, such as cigarettes, e-cigarettes, and chewing tobacco. If you need help quitting, ask your health care provider.  Get at least 8 hours of sleep every night.  Find ways to manage stress, such as meditation, deep breathing, or yoga. General  instructions  Keep a journal to find out what may trigger your migraine headaches. For example, write down: ? What you eat and drink. ? How much sleep you get. ? Any change to your diet or medicines.  If you have a migraine headache: ? Avoid things that make your symptoms worse, such as bright lights. ? It may help to lie down in a dark, quiet room. ? Do not drive or use heavy machinery. ? Ask your health care provider what activities are safe for you while you are experiencing symptoms.  Keep all follow-up visits as told by your health care provider. This is important.      Contact a health care provider if:  You develop symptoms that are different or more severe than your usual migraine headache symptoms.  You have more than 15 headache days in one  month. Get help right away if:  Your migraine headache becomes severe.  Your migraine headache lasts longer than 72 hours.  You have a fever.  You have a stiff neck.  You have vision loss.  Your muscles feel weak or like you cannot control them.  You start to lose your balance often.  You have trouble walking.  You faint.  You have a seizure. Summary  A migraine headache is an intense, throbbing pain on one side or both sides of the head. Migraines may also cause other symptoms, such as nausea, vomiting, and sensitivity to light and noise.  This condition may be treated with medicines and lifestyle changes. You may also need to avoid certain things that trigger a migraine headache.  Keep a journal to find out what may trigger your migraine headaches.  Contact your health care provider if you have more than 15 headache days in a month or you develop symptoms that are different or more severe than your usual migraine headache symptoms. This information is not intended to replace advice given to you by your health care provider. Make sure you discuss any questions you have with your health care provider. Document Revised:  12/25/2018 Document Reviewed: 10/15/2018 Elsevier Patient Education  2021 Elsevier Inc.    Ondansetron oral dissolving tablet What is this medicine? ONDANSETRON (on DAN se tron) is used to treat nausea and vomiting caused by chemotherapy. It is also used to prevent or treat nausea and vomiting after surgery. This medicine may be used for other purposes; ask your health care provider or pharmacist if you have questions. COMMON BRAND NAME(S): Zofran ODT What should I tell my health care provider before I take this medicine? They need to know if you have any of these conditions:  heart disease  history of irregular heartbeat  liver disease  low levels of magnesium or potassium in the blood  an unusual or allergic reaction to ondansetron, granisetron, other medicines, foods, dyes, or preservatives  pregnant or trying to get pregnant  breast-feeding How should I use this medicine? These tablets are made to dissolve in the mouth. Do not try to push the tablet through the foil backing. With dry hands, peel away the foil backing and gently remove the tablet. Place the tablet in the mouth and allow it to dissolve, then swallow. While you may take these tablets with water, it is not necessary to do so. Talk to your pediatrician regarding the use of this medicine in children. Special care may be needed. Overdosage: If you think you have taken too much of this medicine contact a poison control center or emergency room at once. NOTE: This medicine is only for you. Do not share this medicine with others. What if I miss a dose? If you miss a dose, take it as soon as you can. If it is almost time for your next dose, take only that dose. Do not take double or extra doses. What may interact with this medicine? Do not take this medicine with any of the following medications:  apomorphine  certain medicines for fungal infections like fluconazole, itraconazole, ketoconazole, posaconazole,  voriconazole  cisapride  dronedarone  pimozide  thioridazine This medicine may also interact with the following medications:  carbamazepine  certain medicines for depression, anxiety, or psychotic disturbances  fentanyl  linezolid  MAOIs like Carbex, Eldepryl, Marplan, Nardil, and Parnate  methylene blue (injected into a vein)  other medicines that prolong the QT interval (cause an abnormal heart rhythm)  like dofetilide, ziprasidone  phenytoin  rifampicin  tramadol This list may not describe all possible interactions. Give your health care provider a list of all the medicines, herbs, non-prescription drugs, or dietary supplements you use. Also tell them if you smoke, drink alcohol, or use illegal drugs. Some items may interact with your medicine. What should I watch for while using this medicine? Check with your doctor or health care professional as soon as you can if you have any sign of an allergic reaction. What side effects may I notice from receiving this medicine? Side effects that you should report to your doctor or health care professional as soon as possible:  allergic reactions like skin rash, itching or hives, swelling of the face, lips, or tongue  breathing problems  confusion  dizziness  fast or irregular heartbeat  feeling faint or lightheaded, falls  fever and chills  loss of balance or coordination  seizures  sweating  swelling of the hands and feet  tightness in the chest  tremors  unusually weak or tired Side effects that usually do not require medical attention (report to your doctor or health care professional if they continue or are bothersome):  constipation or diarrhea  headache This list may not describe all possible side effects. Call your doctor for medical advice about side effects. You may report side effects to FDA at 1-800-FDA-1088. Where should I keep my medicine? Keep out of the reach of children. Store between 2  and 30 degrees C (36 and 86 degrees F). Throw away any unused medicine after the expiration date. NOTE: This sheet is a summary. It may not cover all possible information. If you have questions about this medicine, talk to your doctor, pharmacist, or health care provider.  2021 Elsevier/Gold Standard (2018-08-25 07:14:10) Rizatriptan disintegrating tablets What is this medicine? RIZATRIPTAN (rye za TRIP tan) is used to treat migraines with or without aura. An aura is a strange feeling or visual disturbance that warns you of an attack. It is not used to prevent migraines. This medicine may be used for other purposes; ask your health care provider or pharmacist if you have questions. COMMON BRAND NAME(S): Maxalt-MLT What should I tell my health care provider before I take this medicine? They need to know if you have any of these conditions:  cigarette smoker  circulation problems in fingers and toes  diabetes  heart disease  high blood pressure  high cholesterol  history of irregular heartbeat  history of stroke  kidney disease  liver disease  stomach or intestine problems  an unusual or allergic reaction to rizatriptan, other medicines, foods, dyes, or preservatives  pregnant or trying to get pregnant  breast-feeding How should I use this medicine? Take this medicine by mouth. Follow the directions on the prescription label. Leave the tablet in the sealed blister pack until you are ready to take it. With dry hands, open the blister and gently remove the tablet. If the tablet breaks or crumbles, throw it away and take a new tablet out of the blister pack. Place the tablet in the mouth and allow it to dissolve, and then swallow. Do not cut, crush, or chew this medicine. You do not need water to take this medicine. Do not take it more often than directed. Talk to your pediatrician regarding the use of this medicine in children. While this drug may be prescribed for children as  young as 6 years for selected conditions, precautions do apply. Overdosage: If you think you  have taken too much of this medicine contact a poison control center or emergency room at once. NOTE: This medicine is only for you. Do not share this medicine with others. What if I miss a dose? This does not apply. This medicine is not for regular use. What may interact with this medicine? Do not take this medicine with any of the following medicines:  certain medicines for migraine headache like almotriptan, eletriptan, frovatriptan, naratriptan, rizatriptan, sumatriptan, zolmitriptan  ergot alkaloids like dihydroergotamine, ergonovine, ergotamine, methylergonovine  MAOIs like Carbex, Eldepryl, Marplan, Nardil, and Parnate This medicine may also interact with the following medications:  certain medicines for depression, anxiety, or psychotic disorders  propranolol This list may not describe all possible interactions. Give your health care provider a list of all the medicines, herbs, non-prescription drugs, or dietary supplements you use. Also tell them if you smoke, drink alcohol, or use illegal drugs. Some items may interact with your medicine. What should I watch for while using this medicine? Visit your healthcare professional for regular checks on your progress. Tell your healthcare professional if your symptoms do not start to get better or if they get worse. You may get drowsy or dizzy. Do not drive, use machinery, or do anything that needs mental alertness until you know how this medicine affects you. Do not stand up or sit up quickly, especially if you are an older patient. This reduces the risk of dizzy or fainting spells. Alcohol may interfere with the effect of this medicine. Your mouth may get dry. Chewing sugarless gum or sucking hard candy and drinking plenty of water may help. Contact your healthcare professional if the problem does not go away or is severe. If you take migraine  medicines for 10 or more days a month, your migraines may get worse. Keep a diary of headache days and medicine use. Contact your healthcare professional if your migraine attacks occur more frequently. What side effects may I notice from receiving this medicine? Side effects that you should report to your doctor or health care professional as soon as possible:  allergic reactions like skin rash, itching or hives, swelling of the face, lips, or tongue  chest pain or chest tightness  signs and symptoms of a dangerous change in heartbeat or heart rhythm like chest pain; dizziness; fast, irregular heartbeat; palpitations; feeling faint or lightheaded; falls; breathing problems  signs and symptoms of a stroke like changes in vision; confusion; trouble speaking or understanding; severe headaches; sudden numbness or weakness of the face, arm or leg; trouble walking; dizziness; loss of balance or coordination  signs and symptoms of serotonin syndrome like irritable; confusion; diarrhea; fast or irregular heartbeat; muscle twitching; stiff muscles; trouble walking; sweating; high fever; seizures; chills; vomiting Side effects that usually do not require medical attention (report to your doctor or health care professional if they continue or are bothersome):  diarrhea  dizziness  drowsiness  dry mouth  headache  nausea, vomiting  pain, tingling, numbness in the hands or feet  stomach pain This list may not describe all possible side effects. Call your doctor for medical advice about side effects. You may report side effects to FDA at 1-800-FDA-1088. Where should I keep my medicine? Keep out of the reach of children. Store at room temperature between 15 and 30 degrees C (59 and 86 degrees F). Protect from light and moisture. Throw away any unused medicine after the expiration date. NOTE: This sheet is a summary. It may not cover all possible information.  If you have questions about this  medicine, talk to your doctor, pharmacist, or health care provider.  2021 Elsevier/Gold Standard (2018-03-17 14:58:08)

## 2021-01-31 ENCOUNTER — Telehealth: Payer: Self-pay | Admitting: Neurology

## 2021-01-31 LAB — CBC WITH DIFFERENTIAL/PLATELET
Basophils Absolute: 0 10*3/uL (ref 0.0–0.2)
Basos: 0 %
EOS (ABSOLUTE): 0 10*3/uL (ref 0.0–0.4)
Eos: 1 %
Hematocrit: 40.9 % (ref 34.0–46.6)
Hemoglobin: 13.8 g/dL (ref 11.1–15.9)
Immature Grans (Abs): 0 10*3/uL (ref 0.0–0.1)
Immature Granulocytes: 0 %
Lymphocytes Absolute: 2.4 10*3/uL (ref 0.7–3.1)
Lymphs: 42 %
MCH: 29.9 pg (ref 26.6–33.0)
MCHC: 33.7 g/dL (ref 31.5–35.7)
MCV: 89 fL (ref 79–97)
Monocytes Absolute: 0.3 10*3/uL (ref 0.1–0.9)
Monocytes: 5 %
Neutrophils Absolute: 2.9 10*3/uL (ref 1.4–7.0)
Neutrophils: 52 %
Platelets: 257 10*3/uL (ref 150–450)
RBC: 4.61 x10E6/uL (ref 3.77–5.28)
RDW: 12.4 % (ref 11.7–15.4)
WBC: 5.6 10*3/uL (ref 3.4–10.8)

## 2021-01-31 LAB — COMPREHENSIVE METABOLIC PANEL
ALT: 6 IU/L (ref 0–32)
AST: 15 IU/L (ref 0–40)
Albumin/Globulin Ratio: 1.4 (ref 1.2–2.2)
Albumin: 4.5 g/dL (ref 3.8–4.8)
Alkaline Phosphatase: 59 IU/L (ref 44–121)
BUN/Creatinine Ratio: 24 — ABNORMAL HIGH (ref 9–23)
BUN: 14 mg/dL (ref 6–20)
Bilirubin Total: <0.2 mg/dL (ref 0.0–1.2)
CO2: 18 mmol/L — ABNORMAL LOW (ref 20–29)
Calcium: 9.3 mg/dL (ref 8.7–10.2)
Chloride: 101 mmol/L (ref 96–106)
Creatinine, Ser: 0.58 mg/dL (ref 0.57–1.00)
Globulin, Total: 3.2 g/dL (ref 1.5–4.5)
Glucose: 87 mg/dL (ref 65–99)
Potassium: 4.5 mmol/L (ref 3.5–5.2)
Sodium: 138 mmol/L (ref 134–144)
Total Protein: 7.7 g/dL (ref 6.0–8.5)
eGFR: 121 mL/min/{1.73_m2} (ref >59)

## 2021-01-31 LAB — TSH: TSH: 1.01 u[IU]/mL (ref 0.450–4.500)

## 2021-01-31 NOTE — Telephone Encounter (Addendum)
MRI [place]: [id number] (exp. 01/31/21-03/31/21)  Scheduled at [facility]

## 2021-02-07 ENCOUNTER — Other Ambulatory Visit: Payer: Self-pay

## 2021-02-07 ENCOUNTER — Ambulatory Visit (INDEPENDENT_AMBULATORY_CARE_PROVIDER_SITE_OTHER): Payer: BC Managed Care – PPO

## 2021-02-07 DIAGNOSIS — R200 Anesthesia of skin: Secondary | ICD-10-CM

## 2021-02-07 DIAGNOSIS — G935 Compression of brain: Secondary | ICD-10-CM | POA: Diagnosis not present

## 2021-02-07 DIAGNOSIS — R51 Headache with orthostatic component, not elsewhere classified: Secondary | ICD-10-CM

## 2021-02-07 DIAGNOSIS — R519 Headache, unspecified: Secondary | ICD-10-CM

## 2021-02-07 DIAGNOSIS — H539 Unspecified visual disturbance: Secondary | ICD-10-CM | POA: Diagnosis not present

## 2021-02-07 DIAGNOSIS — R202 Paresthesia of skin: Secondary | ICD-10-CM

## 2021-02-07 MED ORDER — GADOBENATE DIMEGLUMINE 529 MG/ML IV SOLN
20.0000 mL | Freq: Once | INTRAVENOUS | Status: AC | PRN
Start: 2021-02-07 — End: 2021-02-07
  Administered 2021-02-07: 20 mL via INTRAVENOUS

## 2021-02-15 ENCOUNTER — Emergency Department: Payer: BC Managed Care – PPO

## 2021-02-15 ENCOUNTER — Emergency Department
Admission: EM | Admit: 2021-02-15 | Discharge: 2021-02-15 | Disposition: A | Discharge status: Home / Self Care | Payer: BC Managed Care – PPO | Attending: Emergency Medicine | Admitting: Emergency Medicine

## 2021-02-15 ENCOUNTER — Other Ambulatory Visit: Payer: Self-pay

## 2021-02-15 DIAGNOSIS — M25571 Pain in right ankle and joints of right foot: Secondary | ICD-10-CM

## 2021-02-15 DIAGNOSIS — M79671 Pain in right foot: Secondary | ICD-10-CM | POA: Diagnosis present

## 2021-02-15 DIAGNOSIS — M2550 Pain in unspecified joint: Secondary | ICD-10-CM | POA: Insufficient documentation

## 2021-02-15 LAB — CBC WITH DIFFERENTIAL/PLATELET
Abs Immature Granulocytes: 0.01 10*3/uL (ref 0.00–0.07)
Basophils Absolute: 0 10*3/uL (ref 0.0–0.1)
Basophils Relative: 0 %
Eosinophils Absolute: 0 10*3/uL (ref 0.0–0.5)
Eosinophils Relative: 1 %
HCT: 38.2 % (ref 36.0–46.0)
Hemoglobin: 12.7 g/dL (ref 12.0–15.0)
Immature Granulocytes: 0 %
Lymphocytes Relative: 48 %
Lymphs Abs: 2.9 10*3/uL (ref 0.7–4.0)
MCH: 30 pg (ref 26.0–34.0)
MCHC: 33.2 g/dL (ref 30.0–36.0)
MCV: 90.1 fL (ref 80.0–100.0)
Monocytes Absolute: 0.3 10*3/uL (ref 0.1–1.0)
Monocytes Relative: 5 %
Neutro Abs: 2.8 10*3/uL (ref 1.7–7.7)
Neutrophils Relative %: 46 %
Platelets: 256 10*3/uL (ref 150–400)
RBC: 4.24 MIL/uL (ref 3.87–5.11)
RDW: 12.6 % (ref 11.5–15.5)
WBC: 6.1 10*3/uL (ref 4.0–10.5)
nRBC: 0 % (ref 0.0–0.2)

## 2021-02-15 LAB — BASIC METABOLIC PANEL
Anion gap: 8 (ref 5–15)
BUN: 12 mg/dL (ref 6–20)
CO2: 24 mmol/L (ref 22–32)
Calcium: 8.6 mg/dL — ABNORMAL LOW (ref 8.9–10.3)
Chloride: 104 mmol/L (ref 98–111)
Creatinine, Ser: 0.61 mg/dL (ref 0.44–1.00)
GFR, Estimated: >60 mL/min (ref >60)
Glucose, Bld: 81 mg/dL (ref 70–99)
Potassium: 3.8 mmol/L (ref 3.5–5.1)
Sodium: 136 mmol/L (ref 135–145)

## 2021-02-15 LAB — SEDIMENTATION RATE: Sed Rate: 36 mm/hr — ABNORMAL HIGH (ref 0–20)

## 2021-02-15 LAB — URIC ACID: Uric Acid, Serum: 4 mg/dL (ref 2.5–7.1)

## 2021-02-15 MED ORDER — NAPROXEN 500 MG PO TABS
500.0000 mg | ORAL_TABLET | Freq: Two times a day (BID) | ORAL | Status: DC
Start: 2021-02-15 — End: 2022-08-28

## 2021-02-15 MED ORDER — KETOROLAC TROMETHAMINE 30 MG/ML IJ SOLN
30.0000 mg | Freq: Once | INTRAMUSCULAR | Status: AC
  Administered 2021-02-15: 30 mg via INTRAVENOUS

## 2021-02-15 MED ORDER — KETOROLAC TROMETHAMINE 30 MG/ML IJ SOLN: 30.0000 mg | Freq: Once | INTRAMUSCULAR | 0 refills | Status: DC

## 2021-02-15 NOTE — ED Provider Notes (Signed)
[facility] Emergency Department Provider Note   ____________________________________________   Event Date/Time   First MD Initiated Contact with Patient 02/15/21 1039     (approximate)  I have reviewed the triage vital signs and the nursing notes.   HISTORY  Chief Complaint Toe Pain    HPI Judy Park is a 35 y.o. female patient presents with 1 week of nontraumatic right toe/foot pain.  Patient the pain is increased in the last 2 days.  Patient described pain as "achy".  Rates pain as a 9/10.  No palliative measure for complaint.         Past Medical History:  Diagnosis Date  . ADD (attention deficit disorder)   . Chiari I malformation (HCC)   . Migraine     Patient Active Problem List   Diagnosis Date Noted  . Migraine without aura and without status migrainosus, not intractable 01/30/2021  . Carpal tunnel syndrome of left wrist 01/30/2021  . Chiari malformation type I (HCC) 01/30/2021    Past Surgical History:  Procedure Laterality Date  . BREAST REDUCTION SURGERY    . KNEE SURGERY      Prior to Admission medications   Medication Sig Start Date End Date Taking? Authorizing Provider  naproxen (NAPROSYN) 500 MG tablet Take 1 tablet (500 mg total) by mouth 2 (two) times daily with a meal. 02/15/21  Yes Joni Reining, PA-C  amphetamine-dextroamphetamine (ADDERALL XR) 30 MG 24 hr capsule Take by mouth.    [provider]  ondansetron (ZOFRAN-ODT) 4 MG disintegrating tablet Take 1-2 tablets (4-8 mg total) by mouth every 8 (eight) hours as needed. May take with Rizatriptan for migraine or take for general nausea. 01/30/21   Anson Fret, MD  rizatriptan (MAXALT-MLT) 10 MG disintegrating tablet Take 1 tablet (10 mg total) by mouth as needed for migraine. May repeat in 2 hours if needed 01/30/21   Anson Fret, MD    Allergies Mushroom extract complex  Family History  Problem Relation Age of Onset  . Diabetes Mother   .  High blood pressure Mother   . Congestive Heart Failure Mother   . Drug abuse Mother        heroin addiction  . Cancer Other        maternal side   . Chiari malformation Neg Hx     Social History Social History   Tobacco Use  . Smoking status: Never Smoker  . Smokeless tobacco: Never Used  Substance Use Topics  . Alcohol use: Yes    Comment: occasionally  . Drug use: No    Review of Systems  Constitutional: No fever/chills Eyes: No visual changes. ENT: No sore throat. Cardiovascular: Denies chest pain. Respiratory: Denies shortness of breath. Gastrointestinal: No abdominal pain.  No nausea, no vomiting.  No diarrhea.  No constipation. Genitourinary: Negative for dysuria. Musculoskeletal: Negative for back pain. Skin: Negative for rash. Neurological: Negative for headaches, focal weakness or numbness. Allergic/Immunilogical: Mushroom extract ____________________________________________   PHYSICAL EXAM:  VITAL SIGNS: ED Triage Vitals  Enc Vitals Group     BP 02/15/21 1017 129/69     Pulse Rate 02/15/21 1017 69     Resp 02/15/21 1017 18     Temp 02/15/21 1017 98.7 F (37.1 C)     Temp Source 02/15/21 1017 Oral     SpO2 02/15/21 1017 99 %     Weight 02/15/21 1015 250 lb (113.4 kg)     Height 02/15/21 1015  5\' 1"  (1.549 m)     Head Circumference --      Peak Flow --      Pain Score 02/15/21 1015 9     Pain Loc --      Pain Edu? --      Excl. in GC? --    Constitutional: Alert and oriented. Well appearing and in no acute distress. Cardiovascular: Normal rate, regular rhythm. Grossly normal heart sounds.  Good peripheral circulation. Respiratory: Normal respiratory effort.  No retractions. Lungs CTAB. Musculoskeletal: No obvious deformity to the right foot.  Mild edema is appreciated.  Patient tender palpation dorsal aspect of foot.   Neurologic:  Normal speech and language. No gross focal neurologic deficits are appreciated. No gait instability. Skin:  Skin is  warm, dry and intact. No rash noted.  No abrasions or ecchymosis. Psychiatric: Mood and affect are normal. Speech and behavior are normal.  ____________________________________________   LABS (all labs ordered are listed, but only abnormal results are displayed)  Labs Reviewed  BASIC METABOLIC PANEL - Abnormal; Notable for the following components:      Result Value   Calcium 8.6 (*)    All other components within normal limits  SEDIMENTATION RATE - Abnormal; Notable for the following components:   Sed Rate 36 (*)    All other components within normal limits  CBC WITH DIFFERENTIAL/PLATELET  URIC ACID   ____________________________________________  EKG   ____________________________________________  RADIOLOGY I, 04/17/21, personally viewed and evaluated these images (plain radiographs) as part of my medical decision making, as well as reviewing the written report by the radiologist.  ED MD interpretation: No acute findings x-ray of the right foot.  Official radiology report(s): DG Foot Complete Right  Result Date: 02/15/2021 CLINICAL DATA:  35 year old female with 1 week of increasing right foot pain. No known injury. EXAM: RIGHT FOOT COMPLETE - 3+ VIEW COMPARISON:  None. FINDINGS: Bone mineralization is within normal limits. There is no evidence of fracture or dislocation. There is no evidence of arthropathy or other focal bone abnormality. No discrete soft tissue abnormality. Mild degenerative spurring at the medial malleolus. IMPRESSION: Negative. Electronically Signed   By: 31 M.D.   On: 02/15/2021 11:15    ____________________________________________   PROCEDURES  Procedure(s) performed (including Critical Care):  Procedures   ____________________________________________   INITIAL IMPRESSION / ASSESSMENT AND PLAN / ED COURSE  As part of my medical decision making, I reviewed the following data within the electronic MEDICAL RECORD NUMBER          Patient presents with nonprovocative right foot pain.  Differential consistent gout, infection, stress fracture.  Discussed no acute findings on x-ray of the right foot with patient.  Discussed lab results was remarkable only for elevated sed rate.  Patient complaining physical exam consistent with right foot pain.  Patient given discharge care instruction prescription for naproxen.  Patient vies follow-up with PCP.   ____________________________________________   FINAL CLINICAL IMPRESSION(S) / ED DIAGNOSES  Final diagnoses:  Arthralgia of right foot     ED Discharge Orders         Ordered    naproxen (NAPROSYN) 500 MG tablet  2 times daily with meals        02/15/21 1227    ketorolac (TORADOL) 30 MG/ML injection   Once,   Status:  Discontinued        02/15/21 1311           Note:  This document was  prepared using Conservation officer, historic buildings and may include unintentional dictation errors.    Joni Reining, PA-C 02/15/21 1317    Gilles Chiquito, MD 02/15/21 [phone]

## 2021-02-15 NOTE — ED Triage Notes (Signed)
Pt states that her R big toe has been painful for the past 3 days- pt toe has no swelling or redness noted- pt denies injury

## 2021-02-15 NOTE — Discharge Instructions (Signed)
No acute findings on x-ray of the right foot.  Your lab revealed elevated sed rate consistent with inflammation.  Read and follow discharge care instruction.  Take medication as directed

## 2021-02-15 NOTE — ED Notes (Signed)
Pain right big toe that radiates back under her foot.  Tender to palpation, but no sores or redness seen.  Says it feels numb when she walks on it.  Says it has been h urting for a few days.  No injury, but does remember it started when she was walking.

## 2021-03-07 ENCOUNTER — Ambulatory Visit: Payer: BC Managed Care – PPO | Admitting: Diagnostic Neuroimaging

## 2021-06-04 ENCOUNTER — Ambulatory Visit: Payer: BC Managed Care – PPO | Admitting: Family Medicine

## 2021-12-27 ENCOUNTER — Telehealth: Payer: Self-pay | Admitting: Neurology

## 2021-12-27 NOTE — Telephone Encounter (Signed)
Called patient back VM box is full will call back later today . ?

## 2021-12-27 NOTE — Telephone Encounter (Signed)
Spoke with patient made her aware the weight loss center has to send  [place]  the clearance form  for her Surgery. Gave patient fax number and informed patient Dr Jaynee Eagles was out this week and it wont be until Monday before form can be filled out. Pt expressed understanding and thanked me for call back  ?

## 2021-12-27 NOTE — Telephone Encounter (Signed)
Pt is having gastric sleeve bariatric surgery. [facility] is requesting medical clearance from Neurologist. Pt requesting Medical Clearance be faxed to [facility].  ?Fax: [phone] ? ?

## 2022-01-16 ENCOUNTER — Encounter (HOSPITAL_COMMUNITY): Payer: Self-pay | Admitting: Emergency Medicine

## 2022-01-16 ENCOUNTER — Other Ambulatory Visit: Payer: Self-pay

## 2022-01-16 ENCOUNTER — Emergency Department (HOSPITAL_COMMUNITY)
Admission: EM | Admit: 2022-01-16 | Discharge: 2022-01-17 | Discharge status: Against Medical Advice | Payer: No Typology Code available for payment source | Attending: Student | Admitting: Student

## 2022-01-16 DIAGNOSIS — Z5321 Procedure and treatment not carried out due to patient leaving prior to being seen by health care provider: Secondary | ICD-10-CM | POA: Insufficient documentation

## 2022-01-16 DIAGNOSIS — R519 Headache, unspecified: Secondary | ICD-10-CM | POA: Diagnosis not present

## 2022-01-16 DIAGNOSIS — R5383 Other fatigue: Secondary | ICD-10-CM | POA: Insufficient documentation

## 2022-01-16 DIAGNOSIS — H53149 Visual discomfort, unspecified: Secondary | ICD-10-CM | POA: Diagnosis not present

## 2022-01-16 DIAGNOSIS — R110 Nausea: Secondary | ICD-10-CM | POA: Insufficient documentation

## 2022-01-16 NOTE — ED Provider Triage Note (Signed)
Emergency Medicine Provider Triage Evaluation Note ? ?Judy Park , a 36 y.o. female  was evaluated in triage.  Pt complains of suspected carbon monoxide exposure.  She moved into a new apartment recently and has been using the heat last few days.  States she is having a throbbing headache and feels nauseated but has not vomited.  She is very concerned that she could have been exposed to carbon monoxide.. ? ?Review of Systems  ?Per HPI ? ?Physical Exam  ?BP 137/73 (BP Location: Right Arm)   Pulse 72   Temp 98.6 ?F (37 ?C) (Oral)   Resp 18   Ht 5\' 1"  (1.549 m)   Wt 122.9 kg   LMP 12/18/2021 (Approximate)   SpO2 100%   BMI 51.21 kg/m?  ?Gen:   Awake, no distress   ?Resp:  Normal effort  ?MSK:   Moves extremities without difficulty  ?Other:  Lungs are clear to auscultation bilaterally, cranial nerves II through XII are grossly intact.  Mentating well.  Skin skin is warm and well-perfused ? ?Medical Decision Making  ?Medically screening exam initiated at 11:16 PM.  Appropriate orders placed.  Judy Park was informed that the remainder of the evaluation will be completed by another provider, this initial triage assessment does not replace that evaluation, and the importance of remaining in the ED until their evaluation is complete. ? ? ?  ?Judy Esters, PA-C ?01/16/22 2317 ? ?

## 2022-01-16 NOTE — ED Triage Notes (Signed)
Pt c/o headache, eyes burning, nausea, fatigue intermittently x 1 week, pt reports concern for CO2 poisoning  ?

## 2022-01-17 LAB — CBC WITH DIFFERENTIAL/PLATELET
Abs Immature Granulocytes: 0.03 10*3/uL (ref 0.00–0.07)
Basophils Absolute: 0 10*3/uL (ref 0.0–0.1)
Basophils Relative: 0 %
Eosinophils Absolute: 0.1 10*3/uL (ref 0.0–0.5)
Eosinophils Relative: 1 %
HCT: 42.9 % (ref 36.0–46.0)
Hemoglobin: 13.5 g/dL (ref 12.0–15.0)
Immature Granulocytes: 0 %
Lymphocytes Relative: 43 %
Lymphs Abs: 3.5 10*3/uL (ref 0.7–4.0)
MCH: 28.7 pg (ref 26.0–34.0)
MCHC: 31.5 g/dL (ref 30.0–36.0)
MCV: 91.3 fL (ref 80.0–100.0)
Monocytes Absolute: 0.4 10*3/uL (ref 0.1–1.0)
Monocytes Relative: 5 %
Neutro Abs: 4.2 10*3/uL (ref 1.7–7.7)
Neutrophils Relative %: 51 %
Platelets: 313 10*3/uL (ref 150–400)
RBC: 4.7 MIL/uL (ref 3.87–5.11)
RDW: 13.2 % (ref 11.5–15.5)
WBC: 8.2 10*3/uL (ref 4.0–10.5)
nRBC: 0 % (ref 0.0–0.2)

## 2022-01-17 LAB — I-STAT BETA HCG BLOOD, ED (MC, WL, AP ONLY): I-stat hCG, quantitative: <5 m[IU]/mL (ref <5)

## 2022-01-17 LAB — BASIC METABOLIC PANEL
Anion gap: 8 (ref 5–15)
BUN: 14 mg/dL (ref 6–20)
CO2: 24 mmol/L (ref 22–32)
Calcium: 9.7 mg/dL (ref 8.9–10.3)
Chloride: 103 mmol/L (ref 98–111)
Creatinine, Ser: 0.66 mg/dL (ref 0.44–1.00)
GFR, Estimated: >60 mL/min (ref >60)
Glucose, Bld: 103 mg/dL — ABNORMAL HIGH (ref 70–99)
Potassium: 4 mmol/L (ref 3.5–5.1)
Sodium: 135 mmol/L (ref 135–145)

## 2022-01-17 NOTE — ED Notes (Signed)
Pt stated that the wait was took long. Pt was seen leaving the ED.  ?

## 2022-08-01 NOTE — Telephone Encounter (Signed)
We received the clearance form for the patient's laparoscopic sleeve gastrectomy.  Patient was last seen May 2022.  Dr Lucia Gaskins to address when back in office on 08/05/22.

## 2022-08-06 NOTE — Telephone Encounter (Addendum)
Have not seen her in 18 monts, cannot comment on clearance soorry

## 2022-08-06 NOTE — Telephone Encounter (Signed)
I faxed Dr Trevor Mace signed response back to [facility] @ [phone]. Received a receipt of confirmation.

## 2022-08-17 IMAGING — DX DG FOOT COMPLETE 3+V*R*
3 series · 3 of 3 positions shown · non-contrast
Comparison: None.

CLINICAL DATA: 35-year-old female with 1 week of increasing right
foot pain. No known injury.

EXAM:
RIGHT FOOT COMPLETE - 3+ VIEW

[foot ap]
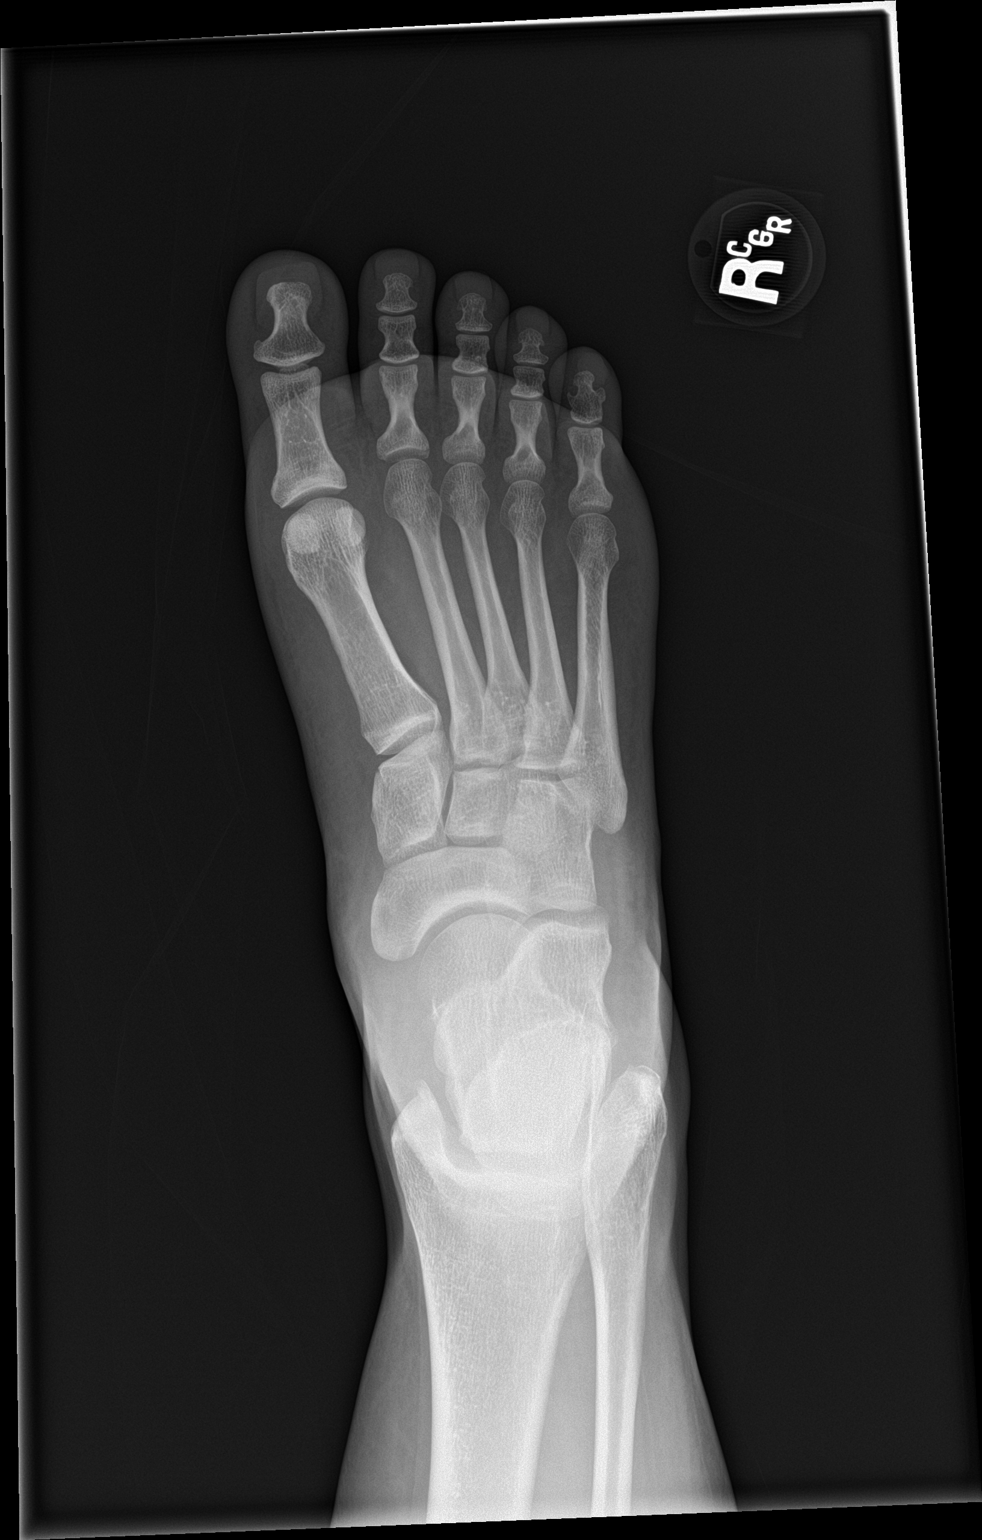

[foot obl]
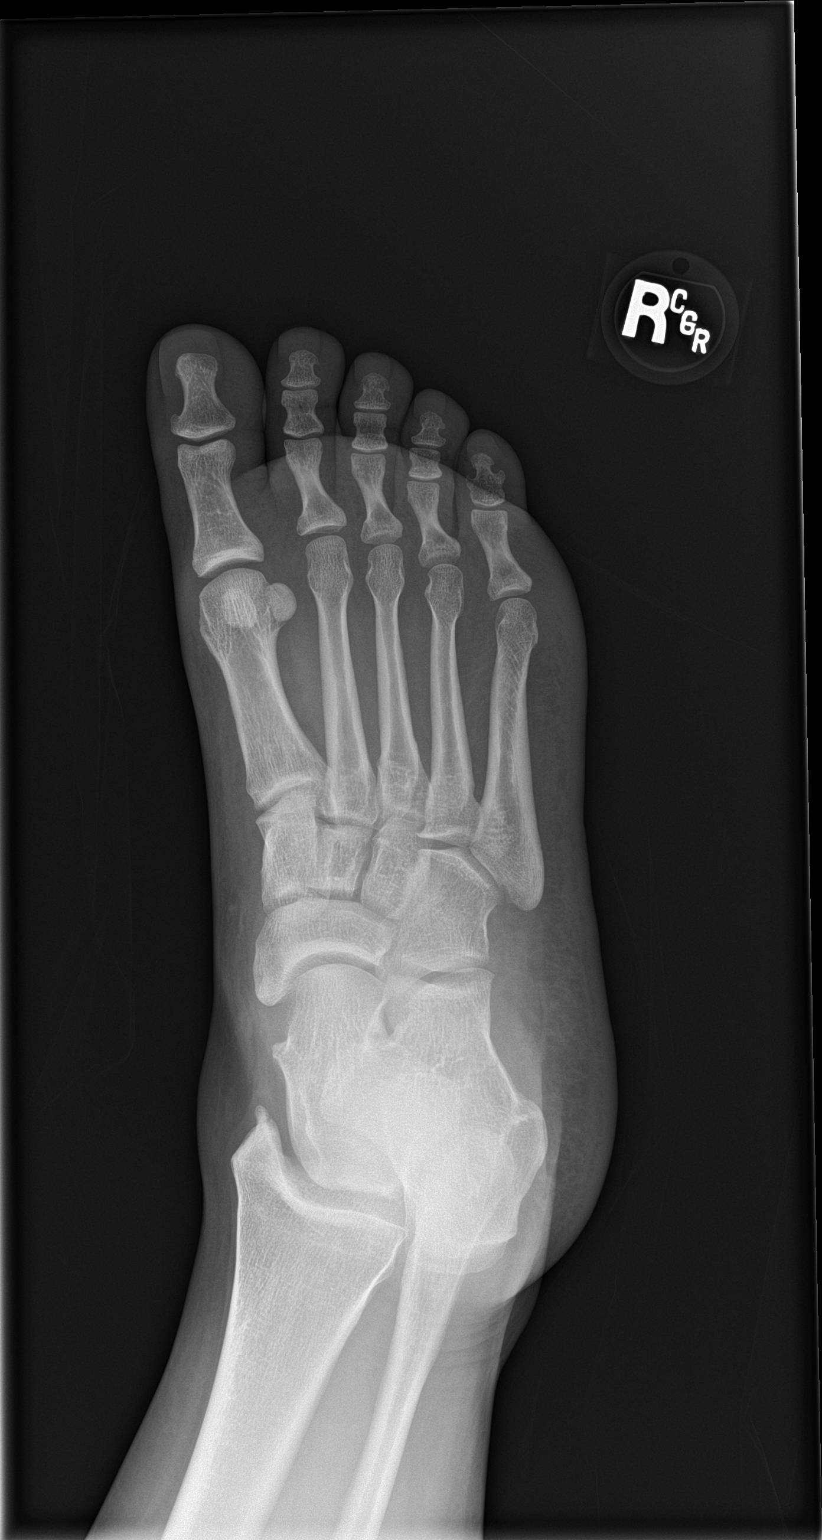

[foot lat]
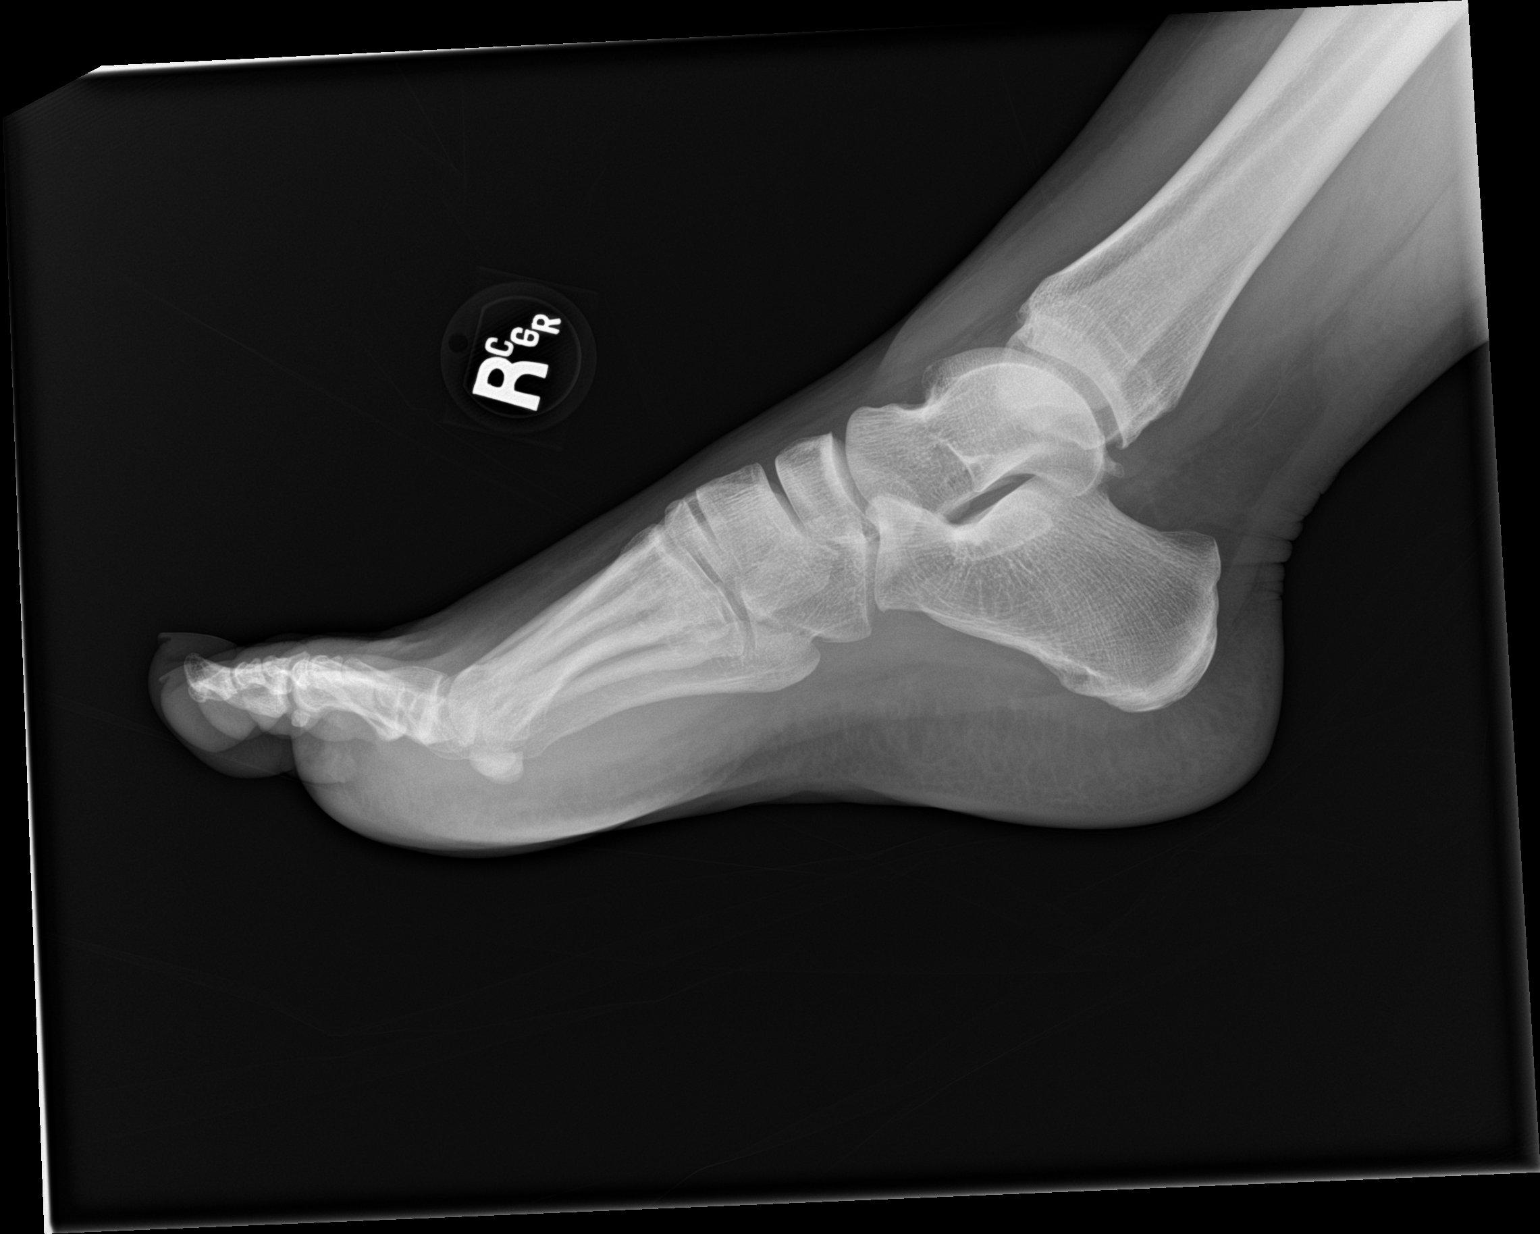

[3 of 3 positions shown; findings below may reference images not displayed]

FINDINGS: Bone mineralization is within normal limits. There is no evidence of
fracture or dislocation. There is no evidence of arthropathy or
other focal bone abnormality. No discrete soft tissue abnormality.
Mild degenerative spurring at the medial malleolus.
IMPRESSION: Negative.

## 2022-08-27 ENCOUNTER — Telehealth: Payer: Self-pay | Admitting: Neurology

## 2022-08-27 NOTE — Telephone Encounter (Signed)
I called pt and she would like to come in sooner for appt.  Made appt 08-29-2023 with MM/NP.  Pt appreciative.

## 2022-08-27 NOTE — Telephone Encounter (Signed)
Judy Park Pt has an appointment to gain clearance for surgery,. She has asked if she can do a VV or come into the office. Dr. Lucia Gaskins stated in her  notes she wasn't able to clear her because she hasn't seen her since 2022. If the appt needs to be cancelled please let the patient know.

## 2022-08-27 NOTE — Telephone Encounter (Signed)
I think she needs an in-person visit with a neuro exam. Please schedule with nurse practitioner please.

## 2022-08-28 ENCOUNTER — Encounter: Payer: Self-pay | Admitting: Adult Health

## 2022-08-28 ENCOUNTER — Ambulatory Visit: Payer: No Typology Code available for payment source | Admitting: Adult Health

## 2022-08-28 VITALS — BP 128/76 | HR 91 | Ht 61.5 in | Wt 272.0 lb

## 2022-08-28 DIAGNOSIS — G935 Compression of brain: Secondary | ICD-10-CM | POA: Diagnosis not present

## 2022-08-28 DIAGNOSIS — G43009 Migraine without aura, not intractable, without status migrainosus: Secondary | ICD-10-CM

## 2022-08-28 NOTE — Progress Notes (Signed)
PATIENT: Judy Park DOB: [birth year]  REASON FOR VISIT: follow up HISTORY FROM: patient PRIMARY NEUROLOGIST: Dr. Lucia Gaskins   Chief Complaint  Patient presents with   RM 9    Here for evaluation for neurology clearance for bariatric surgery. Dr Su Grand is her surgeon @ [facility]. She reports she is stable. She has a headache with her period but "that is normal". Denies any ringing in ears or visual disturbances.      HISTORY OF PRESENT ILLNESS: Today 08/28/22  Judy Park is a 36 y.o. female who has been followed in this office for migraine headaches. Returns today for follow-up.  Patient reports that she is scheduled for bariatric surgery.  She is here today for medical clearance.  Only has headaches during her menstrual cycle. Uses Midol and that relieves headache. No visual disturbances. No tinnitus.  Overall doing well.  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 - No acute findings.   HISTORY Judy Park is a 36 y.o. female here as requested by Lewis Moccasin, MD for chiari malformation.  I reviewed Dr. Chauncy Passy records: In 2018 she was diagnosed by CAT scan with Chiari malformation due to having recurrent headaches head pain along with problems with balance, she saw a neurologist in [place], she tried some meds cannot recall, aggravating factors include movement especially bending over and standing upright, rapid movement, consistent movement, coughing, straining, no loss of bowel or bladder function, no flares in 2 years until February 2022, she was seen at urgent care at that time.  I also reviewed records from [facility] neurolog[facility]: She presented in February 2019 with an onset of 6 months of headaches constantly, location entire head and parietal left, throbbing, associated symptoms included nausea phonophobia and photophobia, daily headaches, no  preceding episodes, holocephalic but tend to be worse on the left side, also numbness and tingling associated with the headaches, worse with changing positions, her symptoms were suggestive of migraines as well as evidence of Chiari on recent MRI of the brain which could be contributing she was sent to ophthalmology for evaluation of papilledema.  MRI of the cervical spine was also ordered to evaluate for any syrinx that may be associated.  Physician favored migraine variant.  At follow-up appointment the headache had improved on topiramate, she saw ophthalmology with reportedly normal ocular exam, MRI of the cervical spine and MRI of the brain were unrevealing.  No evidence of syrinx on MRI of the cervical spine.  Topamax was prescribed as 50 mg twice daily.   The left side of her hand is always numb, worse at night, wakes her up, also is hairdresser and hurts at work, she had carpal tunnel syndrome diagnosed in 2019 by emg/ncs. She has migraines, ringing in the ears, she is having vision changes blurry vision, right eye feels worse with vision changes, she is always tired. Headaches are in the temples an din the front, pulsating/pounding/throbbing, sound sensitivity, a dark room helps. She is having the headache every other week, for a day, she wakes up with a headache but not severe. Worse with bending. Neck pain. She has imbalance especially with movement of the head flex/ex, she has nausea in general (not just with headaches), she is a hair dresser and having difficulty (more the left hand due to the numbness) and she is dropping things out of the left hand. She may  sometimes feel like "she takes a second swallow" but no gagging or coughing on food. No other focal neurologic deficits, associated symptoms, inciting events or modifiable factors.   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  No suggestion of cervical radiculopathy or polyneuropathy.   I reviewed MRA of the brain report which was "no abnormality identified an MRI of the head without contrast".  November 26, 2017.   MRI of the brain January 2019: Reviewed report "borderline low positions inferior cerebellar tonsils, ventricles sulci and basal cisterns are normal to size and shape for patient's age, no focal areas of significant parenchymal T2 signal abnormality noted, specifically no restricted diffusion to suggest acute subacute infarction, no hemorrhage or mass, flow-void identified in the internal carotid and vertebrobasilar arteries" impression: "Mild Arnold-Chiari type I malformation, otherwise normal MRI of the brain, no hemorrhage mass or evidence of acute to subacute infarction"   I reviewed MRI of the cervical spine report November 26, 2017: Unremarkable cervical spine MRI without contrast specifically no significant central canal or neural foraminal stenosis.".  Every level appeared normal by my review, no syrinx.    REVIEW OF SYSTEMS: Out of a complete 14 system review of symptoms, the patient complains only of the following symptoms, and all other reviewed systems are negative.  ALLERGIES: Allergies  Allergen Reactions   Mushroom Extract Complex     HOME MEDICATIONS: Outpatient Medications Prior to Visit  Medication Sig Dispense Refill   amphetamine-dextroamphetamine (ADDERALL XR) 20 MG 24 hr capsule Take 20 mg by mouth every morning.     amphetamine-dextroamphetamine (ADDERALL) 10 MG tablet Take 10 mg by mouth daily as needed.     sertraline (ZOLOFT) 25 MG tablet Take 25 mg by mouth 2 (two) times daily as needed (anxiety).     amphetamine-dextroamphetamine (ADDERALL XR) 30 MG 24 hr capsule Take by mouth.     naproxen (NAPROSYN) 500 MG tablet Take 1 tablet (500 mg total) by mouth 2 (two) times daily with a meal.  (Patient not taking: Reported on 08/28/2022) 20 tablet 00   ondansetron (ZOFRAN-ODT) 4 MG disintegrating tablet Take 1-2 tablets (4-8 mg total) by mouth every 8 (eight) hours as needed. May take with Rizatriptan for migraine or take for general nausea. (Patient not taking: Reported on 08/28/2022) 30 tablet 3   rizatriptan (MAXALT-MLT) 10 MG disintegrating tablet Take 1 tablet (10 mg total) by mouth as needed for migraine. May repeat in 2 hours if needed (Patient not taking: Reported on 08/28/2022) 9 tablet 11   No facility-administered medications prior to visit.    PAST MEDICAL HISTORY: Past Medical History:  Diagnosis Date   ADD (attention deficit disorder)    Chiari I malformation ([place])    Migraine     PAST SURGICAL HISTORY: Past Surgical History:  Procedure Laterality Date   BREAST REDUCTION SURGERY     KNEE SURGERY      FAMILY HISTORY: Family History  Problem Relation Age of Onset   Diabetes Mother    High blood pressure Mother    Congestive Heart Failure Mother    Drug abuse Mother        heroin addiction   Cancer Other        maternal side    Chiari malformation Neg Hx     SOCIAL  HISTORY: Social History   Socioeconomic History   Marital status: Single    Spouse name: Not on file   Number of children: Not on file   Years of education: Not on file   Highest education level: Not on file  Occupational History   Not on file  Tobacco Use   Smoking status: Never   Smokeless tobacco: Never  Vaping Use   Vaping Use: Never used  Substance and Sexual Activity   Alcohol use: Yes    Comment: occasionally   Drug use: Not Currently    Types: Marijuana   Sexual activity: Yes    Birth control/protection: None  Other Topics Concern   Not on file  Social History Narrative   Caffeine: 0-1 cup/day   Right handed   Lives alone   Social Determinants of Health   Financial Resource Strain: Not on file  Food Insecurity: Not on file  Transportation Needs: Not on file   Physical Activity: Not on file  Stress: Not on file  Social Connections: Not on file  Intimate Partner Violence: Not on file      PHYSICAL EXAM  Vitals:   08/28/22 1342  BP: 128/76  Pulse: 91  Weight: 272 lb (123.4 kg)  Height: 5' 1.5" (1.562 m)   Body mass index is 50.56 kg/m.  Generalized: Well developed, in no acute distress   Neurological examination  Mentation: Alert oriented to time, place, history taking. Follows all commands speech and language fluent Cranial nerve II-XII: Pupils were equal round reactive to light. Extraocular movements were full, visual field were full on confrontational test. Facial sensation and strength were normal. Uvula tongue midline. Head turning and shoulder shrug  were normal and symmetric. Motor: The motor testing reveals 5 over 5 strength of all 4 extremities. Good symmetric motor tone is noted throughout.  Sensory: Sensory testing is intact to soft touch on all 4 extremities. No evidence of extinction is noted.  Coordination: Cerebellar testing reveals good finger-nose-finger and heel-to-shin bilaterally.  Gait and station: Gait is normal.    DIAGNOSTIC DATA (LABS, IMAGING, TESTING) - I reviewed patient records, labs, notes, testing and imaging myself where available.  Lab Results  Component Value Date   WBC 8.2 01/16/2022   HGB 13.5 01/16/2022   HCT 42.9 01/16/2022   MCV 91.3 01/16/2022   PLT 313 01/16/2022      Component Value Date/Time   NA 135 01/16/2022 2331   NA 138 01/30/2021 0903   K 4.0 01/16/2022 2331   CL 103 01/16/2022 2331   CO2 24 01/16/2022 2331   GLUCOSE 103 (H) 01/16/2022 2331   BUN 14 01/16/2022 2331   BUN 14 01/30/2021 0903   CREATININE 0.66 01/16/2022 2331   CALCIUM 9.7 01/16/2022 2331   PROT 7.7 01/30/2021 0903   ALBUMIN 4.5 01/30/2021 0903   AST 15 01/30/2021 0903   ALT 6 01/30/2021 0903   ALKPHOS 59 01/30/2021 0903   BILITOT <0.2 01/30/2021 0903   GFRNONAA >60 01/16/2022 2331   GFRAA >60  03/26/2020 1201    Lab Results  Component Value Date   TSH 1.010 01/30/2021      ASSESSMENT AND PLAN 36 y.o. year old female  has a past medical history of ADD (attention deficit disorder), Chiari I malformation (HCC), and Migraine. here with :  Chiari I malformation 2.  Migraines  Overall the patient is doing well.  Denies any new symptoms.  Headaches are under relatively good control.  From a neurological standpoint  she is cleared for bariatric surgery.  We will fill out appropriate forms.  Follow-up as needed     Ward Givens, MSN, NP-C 08/28/2022, 2:14 PM [facility] [address] [place], [place] [address] [phone]

## 2022-09-02 NOTE — Telephone Encounter (Signed)
Received new blank clearance form for bariatric surgery (laparoscopic sleeve gastrectomy). Form has been signed by MM NP indicating pt is low neuro risk for surgery (seen for appt on 08/29/22). Faxed form and office note back to [facility]. Received a receipt of confirmation.

## 2022-09-03 ENCOUNTER — Ambulatory Visit: Payer: No Typology Code available for payment source | Admitting: Neurology

## 2022-09-10 ENCOUNTER — Emergency Department
Admission: EM | Admit: 2022-09-10 | Discharge: 2022-09-10 | Discharge status: Against Medical Advice | Payer: No Typology Code available for payment source

## 2022-09-11 ENCOUNTER — Other Ambulatory Visit: Payer: Self-pay

## 2022-09-11 ENCOUNTER — Emergency Department (HOSPITAL_COMMUNITY): Payer: No Typology Code available for payment source

## 2022-09-11 ENCOUNTER — Emergency Department (HOSPITAL_COMMUNITY)
Admission: EM | Admit: 2022-09-11 | Discharge: 2022-09-11 | Disposition: A | Discharge status: Home / Self Care | Payer: No Typology Code available for payment source | Attending: Emergency Medicine | Admitting: Emergency Medicine

## 2022-09-11 DIAGNOSIS — M545 Low back pain, unspecified: Secondary | ICD-10-CM | POA: Diagnosis not present

## 2022-09-11 LAB — PREGNANCY, URINE: Preg Test, Ur: NEGATIVE

## 2022-09-11 LAB — URINALYSIS, ROUTINE W REFLEX MICROSCOPIC
Bilirubin Urine: NEGATIVE
Glucose, UA: NEGATIVE mg/dL
Hgb urine dipstick: NEGATIVE
Ketones, ur: NEGATIVE mg/dL
Leukocytes,Ua: NEGATIVE
Nitrite: NEGATIVE
Protein, ur: NEGATIVE mg/dL
Specific Gravity, Urine: 1.026 (ref 1.005–1.030)
pH: 6 (ref 5.0–8.0)

## 2022-09-11 MED ORDER — NAPROXEN 375 MG PO TABS: 375.0000 mg | ORAL_TABLET | Freq: Two times a day (BID) | ORAL | 0 refills | Status: AC

## 2022-09-11 MED ORDER — ACETAMINOPHEN 325 MG PO TABS
650.0000 mg | ORAL_TABLET | Freq: Once | ORAL | Status: DC
  Filled 2022-09-11: qty 2

## 2022-09-11 MED ORDER — LIDOCAINE 5 % EX PTCH
1.0000 | MEDICATED_PATCH | CUTANEOUS | 0 refills | Status: AC
Start: 2022-09-11 — End: ?

## 2022-09-11 MED ORDER — CYCLOBENZAPRINE HCL 10 MG PO TABS: 10.0000 mg | ORAL_TABLET | Freq: Two times a day (BID) | ORAL | 0 refills | Status: AC | PRN

## 2022-09-11 NOTE — ED Provider Triage Note (Signed)
Emergency Medicine Provider Triage Evaluation Note  Judy Park , a 36 y.o. female  was evaluated in triage.  Pt complains of pain in the left back area.  Patient states it is an aching discomfort.  The pain is increasing in severity.  She has not tried taking anything for it.  Pain is in the lower back but towards the costovertebral angle.  No burning with urination.  No blood in the urine.  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  She denies any numbness or weakness in her extremities.  No difficulty walking.  Pt   Review of Systems  Positive: Back pain Negative: Is weakness  Physical Exam  BP (!) 147/76 (BP Location: Left Arm)   Pulse 91   Temp 98 F (36.7 C) (Oral)   Resp 18   Ht 1.562 m (5' 1.5")   Wt 117.9 kg   SpO2 94%   BMI 48.33 kg/m  Gen:   Awake, alert Resp:  Normal effort  MSK:   Moves extremities without difficulty , tenderness palpation paraspinal region upper lumbar spine, Other:  No abdominal tenderness, normal strength and sensation bilateral lower extremities  Medical Decision Making  Medically screening exam initiated at 11:10 AM.  Appropriate orders placed.  Judy Park was informed that the remainder of the evaluation will be completed by another provider, this initial triage assessment does not replace that evaluation, and the importance of remaining in the ED until their evaluation is complete.  Pt requested MRI.  No worrisome findings on exam at this time that would indicate need for emergent MRI   Linwood Dibbles, MD 09/11/22 1112

## 2022-09-11 NOTE — ED Triage Notes (Addendum)
Pt via POV c/o lower and mid back pain x 2 days shooting up her spine. She also has had a few episodes of diarrhea.  Pt has hx Chiari malformation, no other significant medical history.

## 2022-09-11 NOTE — Discharge Instructions (Addendum)
The test today in the ED were reassuring.  Take the medications as prescribed.  Follow-up with your primary care doctor to be rechecked if the symptoms have not resolved over the next week.

## 2022-09-11 NOTE — ED Provider Notes (Signed)
[facility]-EMERGENCY DEPT Provider Note   CSN: [id number] Arrival date & time: 09/11/22  1005     History  Chief Complaint  Patient presents with   Back Pain    Judy Park is a 36 y.o. female.   Back Pain  Patient initially seen by me in triage.  Pt complains of pain in the left back area.  Patient states it is an aching discomfort.  The pain is increasing in severity.  She has not tried taking anything for it.  Pain is in the lower back but towards the costovertebral angle.  No burning with urination.  No blood in the urine.  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  She denies any numbness or weakness in her extremities.  No difficulty walking.  Patient also is scheduled for bariatric surgery and wants to make sure everything will still be okay to proceed with.  Home Medications Prior to Admission medications   Medication Sig Start Date End Date Taking? Authorizing Provider  cyclobenzaprine (FLEXERIL) 10 MG tablet Take 1 tablet (10 mg total) by mouth 2 (two) times daily as needed for muscle spasms. 09/11/22  Yes Linwood Dibbles, MD  lidocaine (LIDODERM) 5 % Place 1 patch onto the skin daily. Remove & Discard patch within 12 hours or as directed by MD 09/11/22  Yes Linwood Dibbles, MD  naproxen (NAPROSYN) 375 MG tablet Take 1 tablet (375 mg total) by mouth 2 (two) times daily. 09/11/22  Yes Linwood Dibbles, MD  amphetamine-dextroamphetamine (ADDERALL XR) 20 MG 24 hr capsule Take 20 mg by mouth every morning. 08/07/22   [provider]  amphetamine-dextroamphetamine (ADDERALL) 10 MG tablet Take 10 mg by mouth daily as needed. 08/07/22   [provider]  sertraline (ZOLOFT) 25 MG tablet Take 25 mg by mouth 2 (two) times daily as needed (anxiety). 08/07/22   [provider]      Allergies    Mushroom extract complex    Review of Systems   Review of Systems  Musculoskeletal:  Positive for  back pain.    Physical Exam Updated Vital Signs BP 135/75   Pulse 69   Temp 98.5 F (36.9 C) (Oral)   Resp 18   Ht 1.562 m (5' 1.5")   Wt 117.9 kg   SpO2 96%   BMI 48.33 kg/m  Physical Exam Vitals and nursing note reviewed.  Constitutional:      General: She is not in acute distress.    Appearance: She is well-developed.  HENT:     Head: Normocephalic and atraumatic.     Right Ear: External ear normal.     Left Ear: External ear normal.  Eyes:     General: No scleral icterus.       Right eye: No discharge.        Left eye: No discharge.     Conjunctiva/sclera: Conjunctivae normal.  Neck:     Trachea: No tracheal deviation.  Cardiovascular:     Rate and Rhythm: Normal rate and regular rhythm.  Pulmonary:     Effort: Pulmonary effort is normal. No respiratory distress.     Breath sounds: Normal breath sounds. No stridor. No wheezing or rales.  Abdominal:     General: Bowel sounds are normal. There is no distension.     Palpations: Abdomen is soft.     Tenderness: There is no abdominal tenderness. There is no guarding or rebound.  Musculoskeletal:  General: No deformity.     Cervical back: Normal and neck supple.     Thoracic back: Normal.     Lumbar back: Tenderness present. No swelling. Normal range of motion.  Skin:    General: Skin is warm and dry.     Findings: No rash.  Neurological:     General: No focal deficit present.     Mental Status: She is alert.     Cranial Nerves: No cranial nerve deficit, dysarthria or facial asymmetry.     Sensory: No sensory deficit.     Motor: No abnormal muscle tone or seizure activity.     Coordination: Coordination normal.  Psychiatric:        Mood and Affect: Mood normal.     ED Results / Procedures / Treatments   Labs (all labs ordered are listed, but only abnormal results are displayed) Labs Reviewed  URINALYSIS, ROUTINE W REFLEX MICROSCOPIC  PREGNANCY, URINE    EKG None  Radiology DG Lumbar Spine  Complete  Result Date: 09/11/2022 CLINICAL DATA:  Low back pain. EXAM: LUMBAR SPINE - COMPLETE 4+ VIEW COMPARISON:  None Available. FINDINGS: There is no evidence of lumbar spine fracture. Alignment is normal. Intervertebral disc spaces are maintained. IMPRESSION: Negative. Electronically Signed   By: Lupita Raider M.D.   On: 09/11/2022 12:18    Procedures Procedures    Medications Ordered in ED Medications  acetaminophen (TYLENOL) tablet 650 mg (has no administration in time range)    ED Course/ Medical Decision Making/ A&P                           Medical Decision Making Amount and/or Complexity of Data Reviewed Labs: ordered.    Details: Urinalysis unremarkable. Radiology: ordered and independent interpretation performed.    Details: Trays without acute abnormalities.  Risk OTC drugs. Prescription drug management.   Patient presented to ED with complaints of low back pain.  She is not having any signs of neurologic deficits.  She does not have radicular symptoms.  Patient did have some tenderness in the upper lumbar spine more on the left side.  Urinalysis performed was normal to suggest pyelonephritis or ureterolithiasis.  Suspect this is musculoskeletal in nature. Outpt follow up.         Final Clinical Impression(s) / ED Diagnoses Final diagnoses:  Acute left-sided low back pain, unspecified whether sciatica present    Rx / DC Orders ED Discharge Orders          Ordered    cyclobenzaprine (FLEXERIL) 10 MG tablet  2 times daily PRN        09/11/22 1543    naproxen (NAPROSYN) 375 MG tablet  2 times daily        09/11/22 1543    lidocaine (LIDODERM) 5 %  Every 24 hours        09/11/22 1543              Linwood Dibbles, MD 09/11/22 1547
